# Patient Record
Sex: Male | Born: 1960 | Race: White | Hispanic: No | Smoking: Never smoker
Health system: Southern US, Community
[De-identification: ages and names within clinical notes are randomized; demographics above are authoritative.]

## PROBLEM LIST (undated history)

## (undated) DIAGNOSIS — E7800 Pure hypercholesterolemia, unspecified: Secondary | ICD-10-CM

## (undated) DIAGNOSIS — I639 Cerebral infarction, unspecified: Secondary | ICD-10-CM

## (undated) DIAGNOSIS — Z87442 Personal history of urinary calculi: Secondary | ICD-10-CM

## (undated) DIAGNOSIS — E119 Type 2 diabetes mellitus without complications: Secondary | ICD-10-CM

## (undated) DIAGNOSIS — M1990 Unspecified osteoarthritis, unspecified site: Secondary | ICD-10-CM

## (undated) HISTORY — PX: CHOLECYSTECTOMY: SHX55

## (undated) HISTORY — PX: CYSTOSCOPY W/ URETERAL STENT PLACEMENT: SHX1429

## (undated) HISTORY — PX: APPENDECTOMY: SHX54

## (undated) HISTORY — PX: KNEE ARTHROSCOPY: SUR90

---

## 1997-09-30 HISTORY — PX: SHOULDER ARTHROSCOPY: SHX128

## 1998-08-17 ENCOUNTER — Ambulatory Visit (HOSPITAL_COMMUNITY): Admission: RE | Admit: 1998-08-17 | Discharge: 1998-08-17 | Payer: Self-pay | Admitting: Specialist

## 1998-08-17 ENCOUNTER — Encounter: Payer: Self-pay | Admitting: Specialist

## 2005-02-01 ENCOUNTER — Ambulatory Visit: Payer: Self-pay | Admitting: Cardiology

## 2005-02-02 ENCOUNTER — Emergency Department (HOSPITAL_COMMUNITY): Admission: EM | Admit: 2005-02-02 | Discharge: 2005-02-02 | Payer: Self-pay | Admitting: Emergency Medicine

## 2005-06-14 ENCOUNTER — Ambulatory Visit: Payer: Self-pay | Admitting: Cardiology

## 2005-07-05 ENCOUNTER — Ambulatory Visit: Payer: Self-pay | Admitting: Internal Medicine

## 2005-09-02 ENCOUNTER — Ambulatory Visit: Payer: Self-pay | Admitting: Cardiology

## 2005-09-09 ENCOUNTER — Ambulatory Visit: Payer: Self-pay | Admitting: Cardiology

## 2005-10-14 ENCOUNTER — Ambulatory Visit: Payer: Self-pay | Admitting: Cardiology

## 2005-12-10 ENCOUNTER — Ambulatory Visit: Payer: Self-pay | Admitting: Cardiology

## 2006-04-30 ENCOUNTER — Ambulatory Visit: Payer: Self-pay | Admitting: Cardiology

## 2006-06-06 ENCOUNTER — Ambulatory Visit (HOSPITAL_COMMUNITY): Admission: RE | Admit: 2006-06-06 | Discharge: 2006-06-06 | Payer: Self-pay | Admitting: Specialist

## 2007-06-19 ENCOUNTER — Encounter: Payer: Self-pay | Admitting: Internal Medicine

## 2007-09-16 DIAGNOSIS — R519 Headache, unspecified: Secondary | ICD-10-CM | POA: Insufficient documentation

## 2007-09-16 DIAGNOSIS — R51 Headache: Secondary | ICD-10-CM | POA: Insufficient documentation

## 2007-09-17 ENCOUNTER — Telehealth: Payer: Self-pay | Admitting: Family Medicine

## 2007-09-18 ENCOUNTER — Encounter: Admission: RE | Admit: 2007-09-18 | Discharge: 2007-09-18 | Payer: Self-pay | Admitting: Family Medicine

## 2007-09-18 ENCOUNTER — Telehealth: Payer: Self-pay | Admitting: Family Medicine

## 2007-09-18 ENCOUNTER — Ambulatory Visit: Payer: Self-pay | Admitting: Family Medicine

## 2007-09-18 DIAGNOSIS — Z87442 Personal history of urinary calculi: Secondary | ICD-10-CM | POA: Insufficient documentation

## 2007-09-18 DIAGNOSIS — Z87448 Personal history of other diseases of urinary system: Secondary | ICD-10-CM | POA: Insufficient documentation

## 2007-09-18 DIAGNOSIS — R030 Elevated blood-pressure reading, without diagnosis of hypertension: Secondary | ICD-10-CM | POA: Insufficient documentation

## 2007-12-16 ENCOUNTER — Encounter: Payer: Self-pay | Admitting: Internal Medicine

## 2008-06-03 IMAGING — CR DG SINUSES COMPLETE 3+V
3 series · 3 of 3 positions shown · non-contrast
Comparison: [REDACTED] paranasal sinus radiographs 06/06/06.

CLINICAL DATA: Left facial pain progressive past two days. 
SINUSES THREE VIEWS:

[view not recorded (1 of 3)]
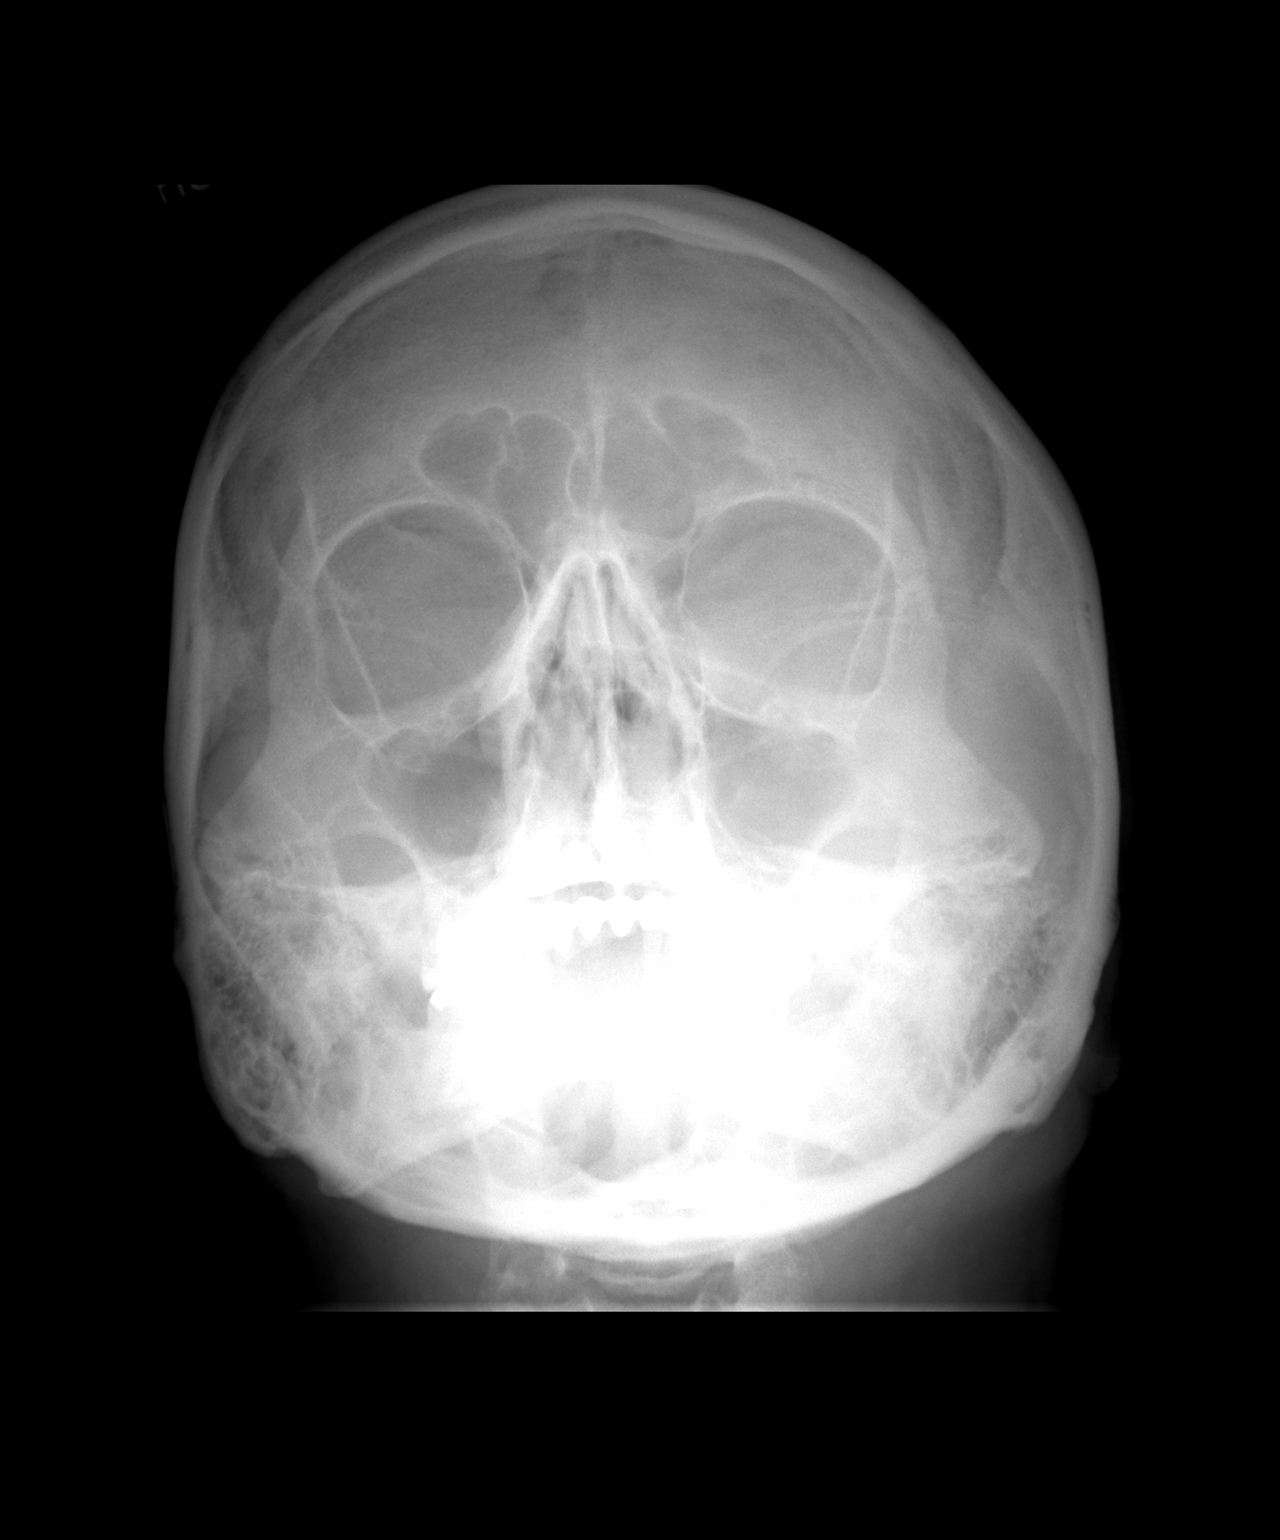

[view not recorded (2 of 3)]
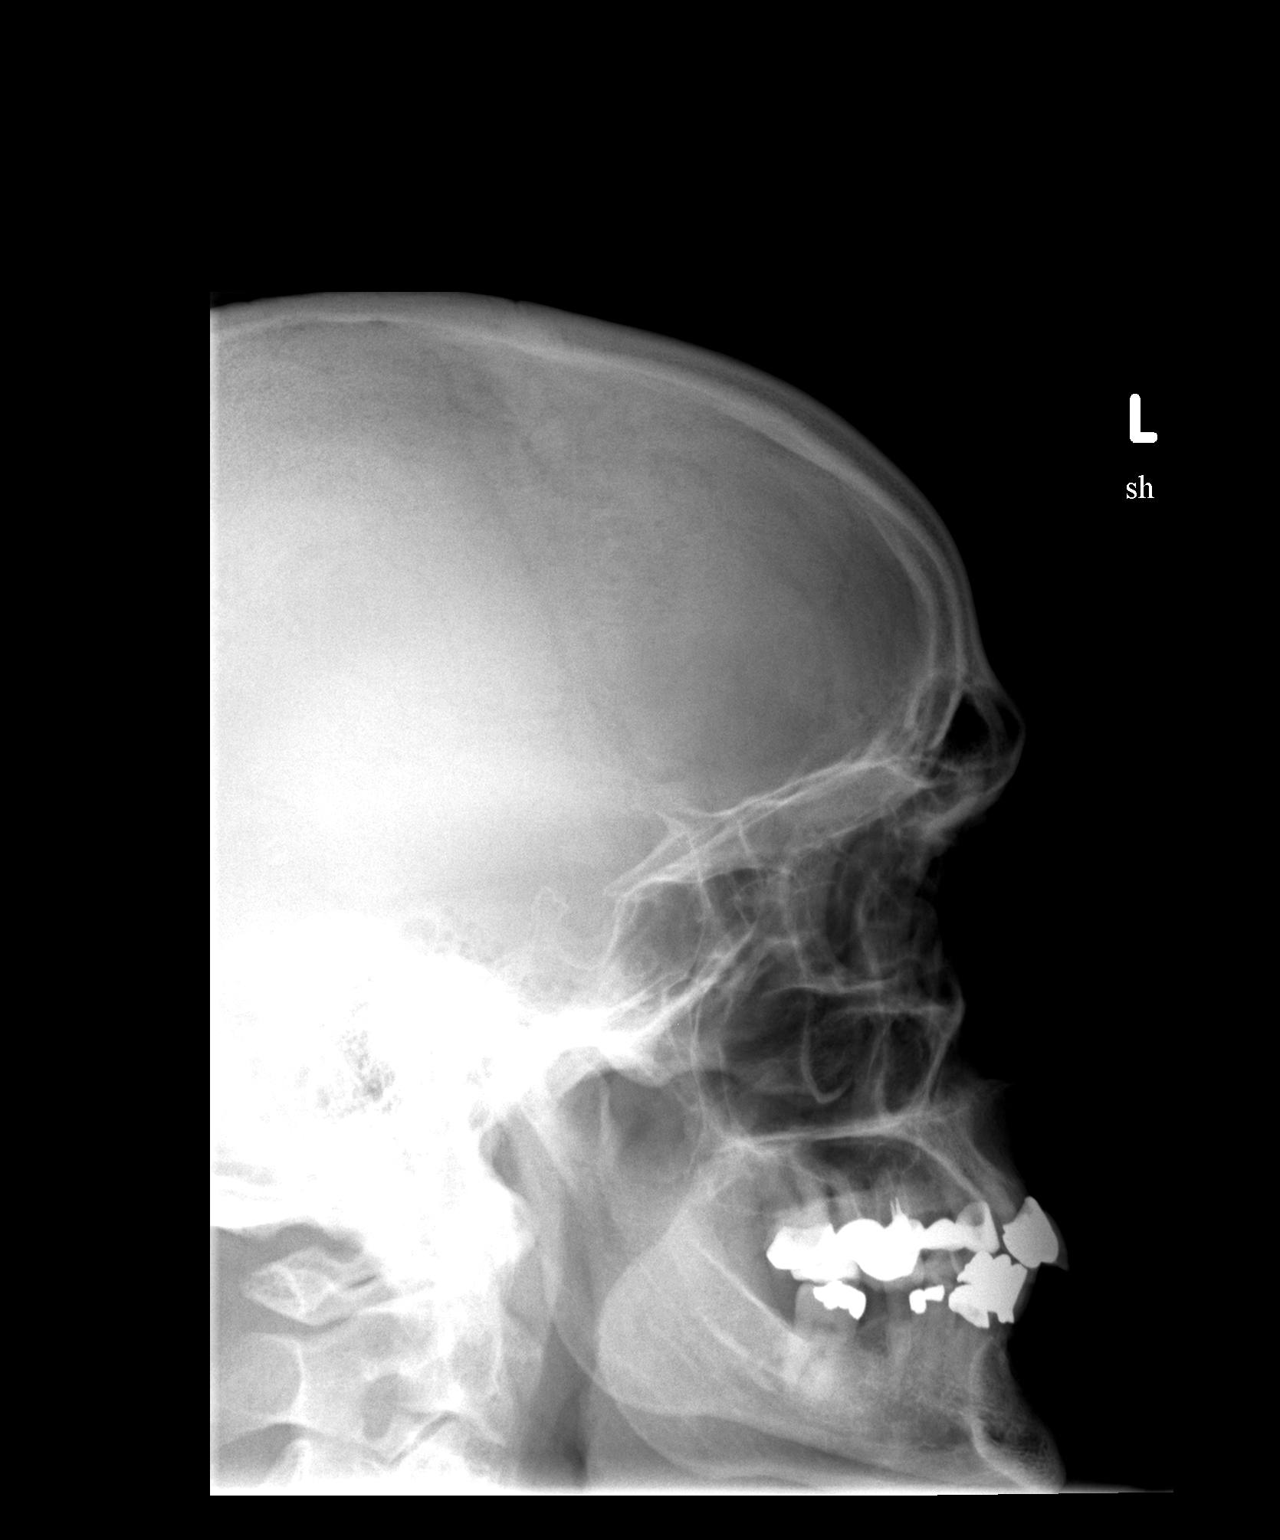

[view not recorded (3 of 3)]
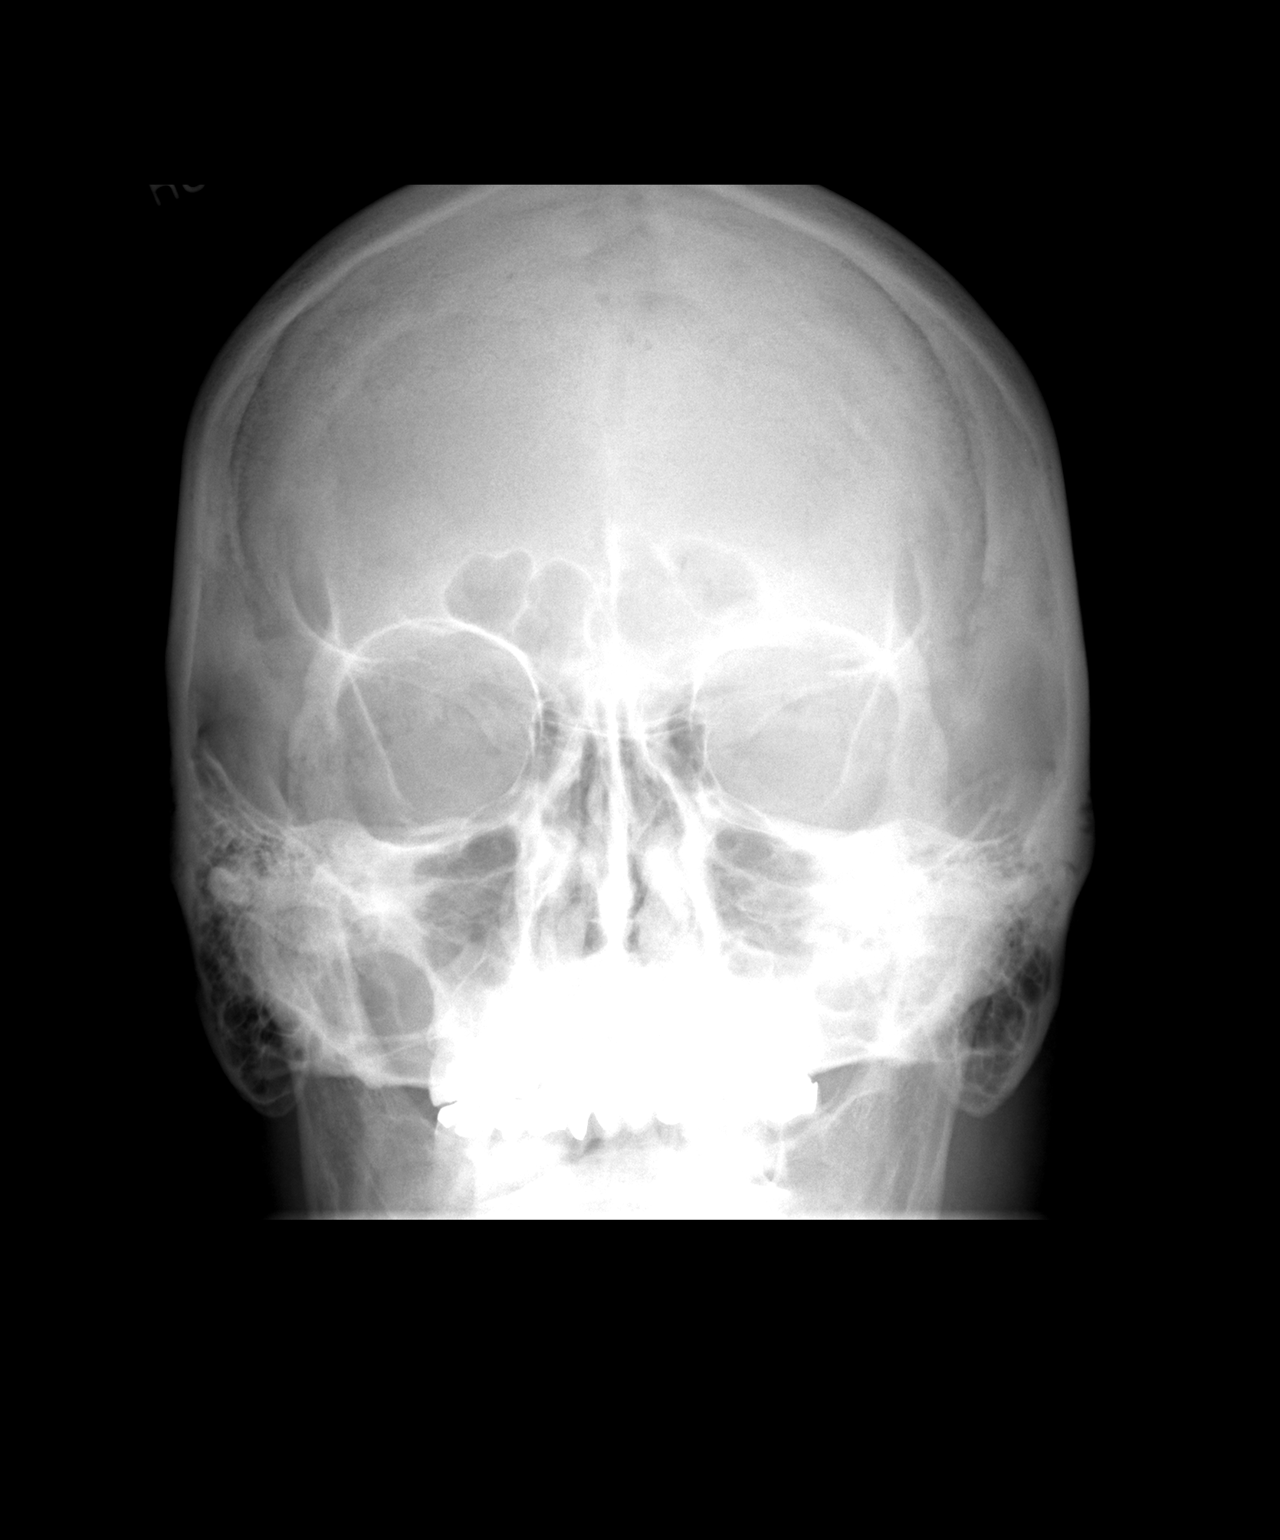

[3 of 3 positions shown; findings below may reference images not displayed]

FINDINGS: No interval change.  Limited coronal CT images were obtained through the paranasal sinuses. There is no evidence of sinus air fluid levels or significant mucosal thickening.  No significant bone abnormalities are identified.
IMPRESSION: Negative.

## 2008-10-07 ENCOUNTER — Encounter: Payer: Self-pay | Admitting: Internal Medicine

## 2011-02-15 NOTE — Assessment & Plan Note (Signed)
[facility]                              CARDIOLOGY OFFICE NOTE   NAME:BECKKoleman, Marling                           MRN:          [id number]  DATE:04/30/2006                            DOB:          January 09, 1961    Mr. Heidinger is seen for cardiology followup.  He is actually doing relatively  well.  He did not tolerate Lipitor.  He agreed to take Zetia and his LDL  came down from 240 to 145.  Because of his family history, I would like to  get him down further and will consider Vytorin.  However, we will first  obtain a followup lipid profile.   He is not having any chest pain.  He is going about normal activities, and  he is doing well.   PAST MEDICAL HISTORY:   ALLERGIES:  NO KNOWN DRUG ALLERGIES OTHER THAN THE FACT THAT HE FEELS POORLY  WITH LIPITOR.   MEDICATIONS:  Zetia 10 mg.   OTHER MEDICAL PROBLEMS:  See the complete list below.   REVIEW OF SYSTEMS:  He is feeling well with no complaints.   PHYSICAL EXAMINATION:  VITAL SIGNS:  Blood pressure 126/88 with a pulse of  71.  GENERAL:  The patient is oriented to person, time, and place and his affect  is normal.  LUNGS:  Clear.  Respiratory effort is not labored.  HEENT:  Reveals no xanthelasma.  He has normal extraocular motion.  There  are no carotid bruits.  There is no jugular venous distention.  CARDIAC:  Reveals an S1 and S2.  There are no clicks or significant murmurs.  ABDOMEN:  Soft.  There are no masses or bruits.  EXTREMITIES:  There is no peripheral edema.  There are no musculoskeletal  deformities.   EKG reveals an interventricular conduction delay that he has had in the  past.  He does have decreased anterior R waves.  He has had this  intermittently in the past.   PROBLEMS:  1.  History of renal stones.  2.  History of abnormal EKG.  EKG is somewhat more abnormal today.  I      considered an echo but I have decided not to.  3.  Hypertensive response on the treadmill.  4.   Borderline high fasting glucose and he needs primary doctor.  5.  Elevated LDL.  See the discussion above.  Fasting lipids to be checked      and then we will be back in touch with him and we may switch him to      Vytorin.                               Luis Abed, MD, [facility]    JDK/MedQ  DD:  04/30/2006  DT:  04/30/2006  Job #:  [id number]

## 2022-07-15 ENCOUNTER — Ambulatory Visit: Payer: Self-pay | Admitting: Orthopedic Surgery

## 2022-08-28 ENCOUNTER — Ambulatory Visit: Payer: Self-pay | Admitting: Orthopedic Surgery

## 2022-08-28 NOTE — H&P (Signed)
[facility] is an 61 y.o. male.   Chief Complaint: R knee pain HPI: Patient is here for his H&P. Patient is scheduled for a right total knee replacement by Dr. Beane on 09/04/22 at [facility].  He has failed conservative treatment at this point with ongoing pain limiting activities of daily living. Patient desires to proceed with surgery.  Dr. Beane and the patient mutually agreed to proceed with a total knee replacement. Risks and benefits of the procedure were discussed including stiffness, suboptimal range of motion, persistent pain, infection requiring removal of prosthesis and reinsertion, need for prophylactic antibiotics in the future, for example, dental procedures, possible need for manipulation, revision in the future and also anesthetic complications including DVT, PE, etc. We discussed the perioperative course, time in the [facility], postoperative recovery and the need for elevation to control swelling. We also discussed the predicted range of motion and the probability that squatting and kneeling would be unobtainable in the future. In addition, postoperative anticoagulation was discussed. We have obtained preoperative medical clearance as necessary. Provided illustrated handout and discussed it in detail. They will enroll in the total joint replacement educational forum at the [facility].  He is scheduled for preop at [facility] tomorrow.  Past Medical Hx: Diabetes Kidney Disease Seizures/Epilepsy Stroke/TIA  Past Surgical Hx: ACL Reconstruction Appendectomy Carpal Tunnel Release Cholecystectomy Colonoscopy Knee Arthroscopy Shoulder Arthroscopy  No family history on file. Social History:  has no history on file for tobacco use, alcohol use, and drug use.  Allergies: No Known Allergies  Current meds: gabapentin 300 mg capsule glimepiride 2 mg tablet Hibiclens 4 % topical liquid ibuprofen 800 mg tablet lamoTRIgine 100 mg tablet levETIRAcetam 1,000 mg  tablet lisinopriL 20 mg-hydrochlorothiazide 12.5 mg tablet metFORMIN 500 mg tablet oxyCODONE-acetaminophen 10 mg-325 mg tablet rosuvastatin 20 mg tablet tamsulosin 0.4 mg capsule temazepam 30 mg capsule tiZANidine 4 mg tablet tobramycin 0.3 %-dexamethasone 0.1 % eye drops,suspension Vitamin D2 1,250 mcg (50,000 unit) capsule  Review of Systems  Constitutional: Negative.   HENT: Negative.    Eyes: Negative.   Respiratory: Negative.    Cardiovascular: Negative.   Gastrointestinal: Negative.   Endocrine: Negative.   Genitourinary: Negative.   Musculoskeletal:  Positive for arthralgias, gait problem, joint swelling and myalgias.  Skin: Negative.   Psychiatric/Behavioral: Negative.      There were no vitals taken for this visit. Physical Exam Constitutional:      Appearance: Normal appearance.  HENT:     Head: Normocephalic.     Right Ear: External ear normal.     Left Ear: External ear normal.     Nose: Nose normal.     Mouth/Throat:     Pharynx: Oropharynx is clear.  Eyes:     Conjunctiva/sclera: Conjunctivae normal.  Cardiovascular:     Rate and Rhythm: Normal rate and regular rhythm.     Pulses: Normal pulses.  Pulmonary:     Effort: Pulmonary effort is normal.  Abdominal:     General: Bowel sounds are normal.  Musculoskeletal:     Cervical back: Normal range of motion.     Comments: He has a positive Lachman. He is tender lateral joint line. He is a mild to moderate effusion his ranges 0-1 10. No DVT  Neurological:     Mental Status: He is alert.      Assessment/Plan Impression: End-stage right knee osteoarthritis  Plan: Pt with end-stage right knee DJD, bone-on-bone, refractory to conservative tx, scheduled for right total knee replacement   by Dr. Shelle Iron on December 6. We again discussed the procedure itself as well as risks, complications and alternatives, including but not limited to DVT, PE, infx, bleeding, failure of procedure, need for secondary procedure  including manipulation, nerve injury, ongoing pain/symptoms, anesthesia risk, even stroke or death. Also discussed typical post-op protocols, activity restrictions, need for PT, flexion/extension exercises, time out of work. Discussed need for DVT ppx post-op per protocol. Discussed dental ppx and infx prevention. Also discussed limitations post-operatively such as kneeling and squatting. All questions were answered. Patient desires to proceed with surgery as scheduled.  Will hold supplements, ASA and NSAIDs accordingly. Will remain NPO after midnight the night before surgery. Will present to [facility] for pre-op testing. Anticipate [facility] stay to include at least 2 midnights given medical history and to ensure proper pain control. Plan [ASA 325mg  BID] for DVT ppx post-op. Plan oxycodone, Robaxin, Colace, Miralax. Plan home with HHPT post-op vs outpt PT with family members at home for assistance. Will follow up 10-14 days post-op for staple removal and xrays.  Plan Right total knee replacement  , PA-C for Dr Dorothy Spark 08/28/2022, 4:33 PM

## 2022-08-28 NOTE — Patient Instructions (Signed)
SURGICAL WAITING ROOM VISITATION Patients having surgery or a procedure may have no more than 2 support people in the waiting area - these visitors may rotate.   Children under the age of 20 must have an adult with them who is not the patient. If the patient needs to stay at the hospital during part of their recovery, the visitor guidelines for inpatient rooms apply. Pre-op nurse will coordinate an appropriate time for 1 support person to accompany patient in pre-op.  This support person may not rotate.    Please refer to the [facility] website for the visitor guidelines for Inpatients (after your surgery is over and you are in a regular room).     Your procedure is scheduled on: 09/04/22   Report to [facility] Main Entrance    Report to admitting at 6:00 AM   Call this number if you have problems the morning of surgery [phone]   Do not eat food :After Midnight.   After Midnight you may have the following liquids until 5:30 AM DAY OF SURGERY  Water Non-Citrus Juices (without pulp, NO RED) Carbonated Beverages Black Coffee (NO MILK/CREAM OR CREAMERS, sugar ok)  Clear Tea (NO MILK/CREAM OR CREAMERS, sugar ok) regular and decaf                             Plain Jell-O (NO RED)                                           Fruit ices (not with fruit pulp, NO RED)                                     Popsicles (NO RED)                                                               Sports drinks like Gatorade (NO RED)    The day of surgery:  Drink ONE (1) Pre-Surgery G2 at 5:30 AM the morning of surgery. Drink in one sitting. Do not sip.  This drink was given to you during your hospital  pre-op appointment visit. Nothing else to drink after completing the  Pre-Surgery G2.          If you have questions, please contact your surgeon's office.   FOLLOW BOWEL PREP AND ANY ADDITIONAL PRE OP INSTRUCTIONS YOU RECEIVED FROM YOUR SURGEON'S OFFICE!!!     Oral Hygiene is also  important to reduce your risk of infection.                                    Remember - BRUSH YOUR TEETH THE MORNING OF SURGERY WITH YOUR REGULAR TOOTHPASTE    Take these medicines the morning of surgery with A SIP OF WATER: Gabapentin, Flomax, Tylenol, Rosuvastatin   DO NOT TAKE ANY ORAL DIABETIC MEDICATIONS DAY OF YOUR SURGERY  How to Manage Your Diabetes Before and After Surgery  Why is it important to  control my blood sugar before and after surgery? Improving blood sugar levels before and after surgery helps healing and can limit problems. A way of improving blood sugar control is eating a healthy diet by:  Eating less sugar and carbohydrates  Increasing activity/exercise  Talking with your doctor about reaching your blood sugar goals High blood sugars (greater than 180 mg/dL) can raise your risk of infections and slow your recovery, so you will need to focus on controlling your diabetes during the weeks before surgery. Make sure that the doctor who takes care of your diabetes knows about your planned surgery including the date and location.  How do I manage my blood sugar before surgery? Check your blood sugar at least 4 times a day, starting 2 days before surgery, to make sure that the level is not too high or low. Check your blood sugar the morning of your surgery when you wake up and every 2 hours until you get to the Short Stay unit. If your blood sugar is less than 70 mg/dL, you will need to treat for low blood sugar: Do not take insulin. Treat a low blood sugar (less than 70 mg/dL) with  cup of clear juice (cranberry or apple), 4 glucose tablets, OR glucose gel. Recheck blood sugar in 15 minutes after treatment (to make sure it is greater than 70 mg/dL). If your blood sugar is not greater than 70 mg/dL on recheck, call [phone] for further instructions. Report your blood sugar to the short stay nurse when you get to Short Stay.  If you are admitted to the hospital after  surgery: Your blood sugar will be checked by the staff and you will probably be given insulin after surgery (instead of oral diabetes medicines) to make sure you have good blood sugar levels. The goal for blood sugar control after surgery is 80-180 mg/dL.   WHAT DO I DO ABOUT MY DIABETES MEDICATION?  Do not take oral diabetes medicines (pills) the morning of surgery.  THE DAY BEFORE SURGERY, take Metformin as prescribed.      THE MORNING OF SURGERY, do not take Metformin   Reviewed and Endorsed by [facility] Patient Education Committee, August 2015             You may not have any metal on your body including jewelry, and body piercing             Do not wear lotions, powders, cologne, or deodorant              Men may shave face and neck.   Do not bring valuables to the hospital. [facility] IS NOT             RESPONSIBLE   FOR VALUABLES.   Bring small overnight bag day of surgery.   DO NOT BRING YOUR HOME MEDICATIONS TO THE HOSPITAL. PHARMACY WILL DISPENSE MEDICATIONS LISTED ON YOUR MEDICATION LIST TO YOU DURING YOUR ADMISSION IN THE HOSPITAL!              Please read over the following fact sheets you were given: IF YOU HAVE QUESTIONS ABOUT YOUR PRE-OP INSTRUCTIONS PLEASE CALL [phone] Contras- Eulonda [place]   If you received a COVID test during your pre-op visit  it is requested that you wear a mask when out in public, stay away from anyone that may not be feeling well and notify your surgeon if you develop symptoms. If you test positive for Covid or have been in contact with anyone that  has tested positive in the last 10 days please notify you surgeon.    [facility] - Preparing for Surgery Before surgery, you can play an important role.  Because skin is not sterile, your skin needs to be as free of germs as possible.  You can reduce the number of germs on your skin by washing with CHG (chlorahexidine gluconate) soap before surgery.  CHG is an antiseptic cleaner which kills germs and  bonds with the skin to continue killing germs even after washing. Please DO NOT use if you have an allergy to CHG or antibacterial soaps.  If your skin becomes reddened/irritated stop using the CHG and inform your nurse when you arrive at Short Stay. Do not shave (including legs and underarms) for at least 48 hours prior to the first CHG shower.  You may shave your face/neck.  Please follow these instructions carefully:  1.  Shower with CHG Soap the night before surgery and the  morning of surgery.  2.  If you choose to wash your hair, wash your hair first as usual with your normal  shampoo.  3.  After you shampoo, rinse your hair and body thoroughly to remove the shampoo.                             4.  Use CHG as you would any other liquid soap.  You can apply chg directly to the skin and wash.  Gently with a scrungie or clean washcloth.  5.  Apply the CHG Soap to your body ONLY FROM THE NECK DOWN.   Do   not use on face/ open                           Wound or open sores. Avoid contact with eyes, ears mouth and   genitals (private parts).                       Wash face,  Genitals (private parts) with your normal soap.             6.  Wash thoroughly, paying special attention to the area where your    surgery  will be performed.  7.  Thoroughly rinse your body with warm water from the neck down.  8.  DO NOT shower/wash with your normal soap after using and rinsing off the CHG Soap.                9.  Pat yourself dry with a clean towel.            10.  Wear clean pajamas.            11.  Place clean sheets on your bed the night of your first shower and do not  sleep with pets. Day of Surgery : Do not apply any lotions/deodorants the morning of surgery.  Please wear clean clothes to the hospital/surgery center.  FAILURE TO FOLLOW THESE INSTRUCTIONS MAY RESULT IN THE CANCELLATION OF YOUR SURGERY  PATIENT SIGNATURE_________________________________  NURSE  SIGNATURE__________________________________  ________________________________________________________________________  Rogelia Mire  An incentive spirometer is a tool that can help keep your lungs clear and active. This tool measures how well you are filling your lungs with each breath. Taking long deep breaths may help reverse or decrease the chance of developing breathing (pulmonary) problems (especially infection) following: A long period of time  when you are unable to move or be active. BEFORE THE PROCEDURE  If the spirometer includes an indicator to show your best effort, your nurse or respiratory therapist will set it to a desired goal. If possible, sit up straight or lean slightly forward. Try not to slouch. Hold the incentive spirometer in an upright position. INSTRUCTIONS FOR USE  Sit on the edge of your bed if possible, or sit up as far as you can in bed or on a chair. Hold the incentive spirometer in an upright position. Breathe out normally. Place the mouthpiece in your mouth and seal your lips tightly around it. Breathe in slowly and as deeply as possible, raising the piston or the ball toward the top of the column. Hold your breath for 3-5 seconds or for as long as possible. Allow the piston or ball to fall to the bottom of the column. Remove the mouthpiece from your mouth and breathe out normally. Rest for a few seconds and repeat Steps 1 through 7 at least 10 times every 1-2 hours when you are awake. Take your time and take a few normal breaths between deep breaths. The spirometer may include an indicator to show your best effort. Use the indicator as a goal to work toward during each repetition. After each set of 10 deep breaths, practice coughing to be sure your lungs are clear. If you have an incision (the cut made at the time of surgery), support your incision when coughing by placing a pillow or rolled up towels firmly against it. Once you are able to get out of  bed, walk around indoors and cough well. You may stop using the incentive spirometer when instructed by your caregiver.  RISKS AND COMPLICATIONS Take your time so you do not get dizzy or light-headed. If you are in pain, you may need to take or ask for pain medication before doing incentive spirometry. It is harder to take a deep breath if you are having pain. AFTER USE Rest and breathe slowly and easily. It can be helpful to keep track of a log of your progress. Your caregiver can provide you with a simple table to help with this. If you are using the spirometer at home, follow these instructions: SEEK MEDICAL CARE IF:  You are having difficultly using the spirometer. You have trouble using the spirometer as often as instructed. Your pain medication is not giving enough relief while using the spirometer. You develop fever of 100.5 F (38.1 C) or higher. SEEK IMMEDIATE MEDICAL CARE IF:  You cough up bloody sputum that had not been present before. You develop fever of 102 F (38.9 C) or greater. You develop worsening pain at or near the incision site. MAKE SURE YOU:  Understand these instructions. Will watch your condition. Will get help right away if you are not doing well or get worse. Document Released: 01/27/2007 Document Revised: 12/09/2011 Document Reviewed: 03/30/2007 [facility] Patient Information 2014 [place], [place].   ________________________________________________________________________

## 2022-08-28 NOTE — H&P (View-Only) (Signed)
Gordon Christian is an 61 y.o. male.   Chief Complaint: R knee pain HPI: Patient is here for his H&P. Patient is scheduled for a right total knee replacement by Dr. Tonita Cong on 09/04/22 at [facility].  He has failed conservative treatment at this point with ongoing pain limiting activities of daily living. Patient desires to proceed with surgery.  Dr. Tonita Cong and the patient mutually agreed to proceed with a total knee replacement. Risks and benefits of the procedure were discussed including stiffness, suboptimal range of motion, persistent pain, infection requiring removal of prosthesis and reinsertion, need for prophylactic antibiotics in the future, for example, dental procedures, possible need for manipulation, revision in the future and also anesthetic complications including DVT, PE, etc. We discussed the perioperative course, time in the hospital, postoperative recovery and the need for elevation to control swelling. We also discussed the predicted range of motion and the probability that squatting and kneeling would be unobtainable in the future. In addition, postoperative anticoagulation was discussed. We have obtained preoperative medical clearance as necessary. Provided illustrated handout and discussed it in detail. They will enroll in the total joint replacement educational forum at the hospital.  He is scheduled for preop at [facility] tomorrow.  Past Medical Hx: Diabetes Kidney Disease Seizures/Epilepsy Stroke/TIA  Past Surgical Hx: ACL Reconstruction Appendectomy Carpal Tunnel Release Cholecystectomy Colonoscopy Knee Arthroscopy Shoulder Arthroscopy  No family history on file. Social History:  has no history on file for tobacco use, alcohol use, and drug use.  Allergies: No Known Allergies  Current meds: gabapentin 300 mg capsule glimepiride 2 mg tablet Hibiclens 4 % topical liquid ibuprofen 800 mg tablet lamoTRIgine 100 mg tablet levETIRAcetam 1,000 mg  tablet lisinopriL 20 mg-hydrochlorothiazide 12.5 mg tablet metFORMIN 500 mg tablet oxyCODONE-acetaminophen 10 mg-325 mg tablet rosuvastatin 20 mg tablet tamsulosin 0.4 mg capsule temazepam 30 mg capsule tiZANidine 4 mg tablet tobramycin 0.3 %-dexamethasone 0.1 % eye drops,suspension Vitamin D2 1,250 mcg (50,000 unit) capsule  Review of Systems  Constitutional: Negative.   HENT: Negative.    Eyes: Negative.   Respiratory: Negative.    Cardiovascular: Negative.   Gastrointestinal: Negative.   Endocrine: Negative.   Genitourinary: Negative.   Musculoskeletal:  Positive for arthralgias, gait problem, joint swelling and myalgias.  Skin: Negative.   Psychiatric/Behavioral: Negative.      There were no vitals taken for this visit. Physical Exam Constitutional:      Appearance: Normal appearance.  HENT:     Head: Normocephalic.     Right Ear: External ear normal.     Left Ear: External ear normal.     Nose: Nose normal.     Mouth/Throat:     Pharynx: Oropharynx is clear.  Eyes:     Conjunctiva/sclera: Conjunctivae normal.  Cardiovascular:     Rate and Rhythm: Normal rate and regular rhythm.     Pulses: Normal pulses.  Pulmonary:     Effort: Pulmonary effort is normal.  Abdominal:     General: Bowel sounds are normal.  Musculoskeletal:     Cervical back: Normal range of motion.     Comments: He has a positive Lachman. He is tender lateral joint line. He is a mild to moderate effusion his ranges 0-1 10. No DVT  Neurological:     Mental Status: He is alert.      Assessment/Plan Impression: End-stage right knee osteoarthritis  Plan: Pt with end-stage right knee DJD, bone-on-bone, refractory to conservative tx, scheduled for right total knee replacement  by Dr. Shelle Iron on December 6. We again discussed the procedure itself as well as risks, complications and alternatives, including but not limited to DVT, PE, infx, bleeding, failure of procedure, need for secondary procedure  including manipulation, nerve injury, ongoing pain/symptoms, anesthesia risk, even stroke or death. Also discussed typical post-op protocols, activity restrictions, need for PT, flexion/extension exercises, time out of work. Discussed need for DVT ppx post-op per protocol. Discussed dental ppx and infx prevention. Also discussed limitations post-operatively such as kneeling and squatting. All questions were answered. Patient desires to proceed with surgery as scheduled.  Will hold supplements, ASA and NSAIDs accordingly. Will remain NPO after midnight the night before surgery. Will present to [facility] for pre-op testing. Anticipate hospital stay to include at least 2 midnights given medical history and to ensure proper pain control. Plan [ASA 325mg  BID] for DVT ppx post-op. Plan oxycodone, Robaxin, Colace, Miralax. Plan home with HHPT post-op vs outpt PT with family members at home for assistance. Will follow up 10-14 days post-op for staple removal and xrays.  Plan Right total knee replacement  , PA-C for Dr Dorothy Spark 08/28/2022, 4:33 PM

## 2022-08-28 NOTE — Progress Notes (Addendum)
COVID Vaccine Completed: yes  Date of COVID positive in last 90 days: no  PCP - Teodoro Spray, MD Cardiologist - [place], DO LOV 06/10/14 Neurologist- Alice Rieger, MD  Chest x-ray - n/a EKG - 08/29/22 Epic/chart Stress Test - yes before 2017 per pt ECHO - 2017 [facility] Cardiac Cath - n/a Pacemaker/ICD device last checked: n/a Spinal Cord Stimulator: n/a  Bowel Prep - n/a  Sleep Study - yes, negative CPAP -   Fasting Blood Sugar - hasn't been checking at home, does not have a glucometer, plans to get one 09/03/22 Checks Blood Sugar   Last dose of GLP1 agonist-  N/A GLP1 instructions:  N/A   Last dose of SGLT-2 inhibitors-  N/A SGLT-2 instructions: N/A   Blood Thinner Instructions: Aspirin Instructions: ASA 325, last dose 08/28/22 2100 Last Dose:  Activity level: Can go up a flight of stairs and perform activities of daily living without stopping and without symptoms of chest pain or shortness of breath.   Anesthesia review: HTN, LBBB, stroke has balance issues, DM2, pt BS was 326. He denies any symptoms today. Reports he drank a soda right before coming in. Instructed pt to get a glucometer and check his sugars at home. Informed he is at risk for cancellation if BS is greater than 250 DOS. Patient verbalized understanding. Creatinine 1.69  Patient denies shortness of breath, fever, cough and chest pain at PAT appointment  Patient verbalized understanding of instructions that were given to them at the PAT appointment. Patient was also instructed that they will need to review over the PAT instructions again at home before surgery.

## 2022-08-29 ENCOUNTER — Encounter (HOSPITAL_COMMUNITY)
Admission: RE | Admit: 2022-08-29 | Discharge: 2022-08-29 | Disposition: A | Discharge status: Home / Self Care | Payer: Medicare Other | Source: Ambulatory Visit | Attending: Specialist | Admitting: Specialist

## 2022-08-29 ENCOUNTER — Encounter (HOSPITAL_COMMUNITY): Payer: Self-pay

## 2022-08-29 VITALS — BP 122/75 | HR 61 | Temp 97.5°F | Resp 14 | Ht 66.5 in | Wt 173.0 lb

## 2022-08-29 DIAGNOSIS — Z8673 Personal history of transient ischemic attack (TIA), and cerebral infarction without residual deficits: Secondary | ICD-10-CM | POA: Diagnosis not present

## 2022-08-29 DIAGNOSIS — M1711 Unilateral primary osteoarthritis, right knee: Secondary | ICD-10-CM | POA: Diagnosis not present

## 2022-08-29 DIAGNOSIS — E119 Type 2 diabetes mellitus without complications: Secondary | ICD-10-CM | POA: Insufficient documentation

## 2022-08-29 DIAGNOSIS — N189 Chronic kidney disease, unspecified: Secondary | ICD-10-CM | POA: Diagnosis not present

## 2022-08-29 DIAGNOSIS — Z01818 Encounter for other preprocedural examination: Secondary | ICD-10-CM | POA: Diagnosis not present

## 2022-08-29 DIAGNOSIS — I447 Left bundle-branch block, unspecified: Secondary | ICD-10-CM | POA: Diagnosis not present

## 2022-08-29 HISTORY — DX: Personal history of urinary calculi: Z87.442

## 2022-08-29 HISTORY — DX: Unspecified osteoarthritis, unspecified site: M19.90

## 2022-08-29 HISTORY — DX: Type 2 diabetes mellitus without complications: E11.9

## 2022-08-29 HISTORY — DX: Cerebral infarction, unspecified: I63.9

## 2022-08-29 HISTORY — DX: Pure hypercholesterolemia, unspecified: E78.00

## 2022-08-29 LAB — CBC
HCT: 45.1 % (ref 39.0–52.0)
Hemoglobin: 14.7 g/dL (ref 13.0–17.0)
MCH: 30.1 pg (ref 26.0–34.0)
MCHC: 32.6 g/dL (ref 30.0–36.0)
MCV: 92.4 fL (ref 80.0–100.0)
Platelets: 122 10*3/uL — ABNORMAL LOW (ref 150–400)
RBC: 4.88 MIL/uL (ref 4.22–5.81)
RDW: 12 % (ref 11.5–15.5)
WBC: 5.8 10*3/uL (ref 4.0–10.5)
nRBC: 0 % (ref 0.0–0.2)

## 2022-08-29 LAB — BASIC METABOLIC PANEL
Anion gap: 10 (ref 5–15)
BUN: 27 mg/dL — ABNORMAL HIGH (ref 8–23)
CO2: 21 mmol/L — ABNORMAL LOW (ref 22–32)
Calcium: 9.3 mg/dL (ref 8.9–10.3)
Chloride: 106 mmol/L (ref 98–111)
Creatinine, Ser: 1.69 mg/dL — ABNORMAL HIGH (ref 0.61–1.24)
GFR, Estimated: 45 mL/min — ABNORMAL LOW (ref >60)
Glucose, Bld: 316 mg/dL — ABNORMAL HIGH (ref 70–99)
Potassium: 3.7 mmol/L (ref 3.5–5.1)
Sodium: 137 mmol/L (ref 135–145)

## 2022-08-29 LAB — HEMOGLOBIN A1C
Hgb A1c MFr Bld: 7.8 % — ABNORMAL HIGH (ref 4.8–5.6)
Mean Plasma Glucose: 177 mg/dL

## 2022-08-29 LAB — SURGICAL PCR SCREEN
MRSA, PCR: NEGATIVE
Staphylococcus aureus: NEGATIVE

## 2022-08-29 LAB — GLUCOSE, CAPILLARY
Glucose-Capillary: 326 mg/dL — ABNORMAL HIGH (ref 70–99)
Glucose-Capillary: 326 mg/dL — ABNORMAL HIGH (ref 70–99)

## 2022-08-30 NOTE — Anesthesia Preprocedure Evaluation (Addendum)
Anesthesia Evaluation  Patient identified by MRN, date of birth, ID band Patient awake    Reviewed: Allergy & Precautions, NPO status , Patient's Chart, lab work & pertinent test results  History of Anesthesia Complications Negative for: history of anesthetic complications  Airway Mallampati: II  TM Distance: >3 FB Neck ROM: Full    Dental  (+) Dental Advisory Given, Teeth Intact   Pulmonary neg pulmonary ROS   Pulmonary exam normal        Cardiovascular hypertension, Pt. on medications Normal cardiovascular exam     Neuro/Psych  Headaches CVA, Residual Symptoms  negative psych ROS   GI/Hepatic negative GI ROS, Neg liver ROS,,,  Endo/Other  diabetes, Type 2, Oral Hypoglycemic Agents    Renal/GU CRFRenal disease     Musculoskeletal  (+) Arthritis ,    Abdominal   Peds  Hematology  Plt 122k    Anesthesia Other Findings   Reproductive/Obstetrics                             Anesthesia Physical Anesthesia Plan  ASA: 3  Anesthesia Plan: Spinal   Post-op Pain Management: Tylenol PO (pre-op)* and Regional block*   Induction:   PONV Risk Score and Plan: 1 and Treatment may vary due to age or medical condition and Propofol infusion  Airway Management Planned: Natural Airway and Simple Face Mask  Additional Equipment: None  Intra-op Plan:   Post-operative Plan:   Informed Consent: I have reviewed the patients History and Physical, chart, labs and discussed the procedure including the risks, benefits and alternatives for the proposed anesthesia with the patient or authorized representative who has indicated his/her understanding and acceptance.       Plan Discussed with: CRNA and Anesthesiologist  Anesthesia Plan Comments: (Labs reviewed, platelets acceptable. Discussed risks and benefits of spinal, including spinal/epidural hematoma, infection, failed block, and PDPH. Patient  expressed understanding and wished to proceed. )       Anesthesia Quick Evaluation

## 2022-08-30 NOTE — Progress Notes (Signed)
Anesthesia Chart Review   Case: [id number] Date/Time: 09/04/22 0815   Procedure: TOTAL KNEE ARTHROPLASTY (Right: Knee)   Anesthesia type: Spinal   Pre-op diagnosis: Right knee degenerative joint disease   Location: [facility] ROOM 06 / [facility]   Surgeons: Jene Every, MD       DISCUSSION:61 y.o. never smoker with h/o CVA, chronic LBBB, DM II, CKD, right knee djd scheduled for above procedure 09/04/2022 with Dr. Jene Every.   Chronic LBBB seen as far back at 2013 in [facility].    Echo 09/13/2016 with Grade I mild diastolic dysfunction, EF 55-60%, no valvular problems.   A1C 7.8, surgeon made aware.   Creatinine 1.69 at PAT visit.  Appears stable.   09/19/2021 Creatinine 1.75 07/10/2021 Creatinine 1.93 01/01/2021 Creatinine 2.02 VS: BP 122/75   Pulse 61   Temp (!) 36.4 C (Oral)   Resp 14   Ht 5' 6.5" (1.689 m)   Wt 78.5 kg   SpO2 98%   BMI 27.50 kg/m   PROVIDERS: Teodoro Spray, MD is PCP    LABS: Labs reviewed: Acceptable for surgery. (all labs ordered are listed, but only abnormal results are displayed)  Labs Reviewed  HEMOGLOBIN A1C - Abnormal; Notable for the following components:      Result Value   Hgb A1c MFr Bld 7.8 (*)    All other components within normal limits  BASIC METABOLIC PANEL - Abnormal; Notable for the following components:   CO2 21 (*)    Glucose, Bld 316 (*)    BUN 27 (*)    Creatinine, Ser 1.69 (*)    GFR, Estimated 45 (*)    All other components within normal limits  CBC - Abnormal; Notable for the following components:   Platelets 122 (*)    All other components within normal limits  GLUCOSE, CAPILLARY - Abnormal; Notable for the following components:   Glucose-Capillary 326 (*)    All other components within normal limits  GLUCOSE, CAPILLARY - Abnormal; Notable for the following components:   Glucose-Capillary 326 (*)    All other components within normal limits  SURGICAL PCR SCREEN     IMAGES:   EKG:   CV: Echo  09/13/2016 The left ventricle is normal in size.  Septal motion is consistent with conduction abnormality.  The right ventricular ejection fraction is normal.  Injection of contrast documented no interatrial shunt .  Pulmonary hypertension is not suggested by Doppler findings.  The aortic valve is trileaflet.  There is no thrombus.  There is normal left ventricular wall thickness.  The left ventricular ejection fraction is normal (55-60%).  Grade I mild diastolic dysfunction; abnormal relaxation pattern.  Past Medical History:  Diagnosis Date   Arthritis    Diabetes mellitus without complication (HCC)    History of kidney stones    Hypercholesteremia    Stroke [facility])     Past Surgical History:  Procedure Laterality Date   APPENDECTOMY     CHOLECYSTECTOMY     CYSTOSCOPY W/ URETERAL STENT PLACEMENT     KNEE ARTHROSCOPY Right    SHOULDER ARTHROSCOPY Right 1999    MEDICATIONS:  acetaminophen (TYLENOL) 650 MG CR tablet   aspirin 325 MG tablet   Carboxymethylcellul-Glycerin (LUBRICATING EYE DROPS OP)   gabapentin (NEURONTIN) 300 MG capsule   levETIRAcetam (KEPPRA) 1000 MG tablet   lisinopril-hydrochlorothiazide (ZESTORETIC) 20-12.5 MG tablet   metFORMIN (GLUCOPHAGE) 500 MG tablet   rosuvastatin (CRESTOR) 20 MG tablet   temazepam (RESTORIL) 30 MG capsule  tiZANidine (ZANAFLEX) 4 MG tablet   Vitamin D, Ergocalciferol, (DRISDOL) 1.25 MG (50000 UNIT) CAPS capsule   No current facility-administered medications for this encounter.     Jodell Cipro Ward, PA-C [facility]Surgical Testing [phone]

## 2022-09-04 ENCOUNTER — Ambulatory Visit (HOSPITAL_COMMUNITY)
Admission: RE | Admit: 2022-09-04 | Discharge: 2022-09-05 | Disposition: A | Discharge status: Home / Self Care | Payer: Medicare Other | Source: Ambulatory Visit | Attending: Specialist | Admitting: Specialist

## 2022-09-04 ENCOUNTER — Ambulatory Visit (HOSPITAL_COMMUNITY): Payer: Medicare Other

## 2022-09-04 ENCOUNTER — Other Ambulatory Visit: Payer: Self-pay

## 2022-09-04 ENCOUNTER — Encounter (HOSPITAL_COMMUNITY): Payer: Self-pay | Admitting: Specialist

## 2022-09-04 ENCOUNTER — Ambulatory Visit (HOSPITAL_BASED_OUTPATIENT_CLINIC_OR_DEPARTMENT_OTHER): Payer: Medicare Other | Admitting: Anesthesiology

## 2022-09-04 ENCOUNTER — Ambulatory Visit (HOSPITAL_COMMUNITY): Payer: Medicare Other | Admitting: Physician Assistant

## 2022-09-04 ENCOUNTER — Encounter (HOSPITAL_COMMUNITY)
Admission: RE | Disposition: A | Discharge status: Home / Self Care | Payer: Self-pay | Source: Ambulatory Visit | Attending: Specialist

## 2022-09-04 DIAGNOSIS — M1711 Unilateral primary osteoarthritis, right knee: Secondary | ICD-10-CM | POA: Diagnosis present

## 2022-09-04 DIAGNOSIS — I6990 Unspecified sequelae of unspecified cerebrovascular disease: Secondary | ICD-10-CM | POA: Diagnosis not present

## 2022-09-04 DIAGNOSIS — I10 Essential (primary) hypertension: Secondary | ICD-10-CM | POA: Diagnosis not present

## 2022-09-04 DIAGNOSIS — Z472 Encounter for removal of internal fixation device: Secondary | ICD-10-CM | POA: Diagnosis not present

## 2022-09-04 DIAGNOSIS — I6930 Unspecified sequelae of cerebral infarction: Secondary | ICD-10-CM | POA: Insufficient documentation

## 2022-09-04 DIAGNOSIS — M21061 Valgus deformity, not elsewhere classified, right knee: Secondary | ICD-10-CM | POA: Insufficient documentation

## 2022-09-04 DIAGNOSIS — E119 Type 2 diabetes mellitus without complications: Secondary | ICD-10-CM | POA: Insufficient documentation

## 2022-09-04 DIAGNOSIS — Z79899 Other long term (current) drug therapy: Secondary | ICD-10-CM | POA: Insufficient documentation

## 2022-09-04 DIAGNOSIS — M1990 Unspecified osteoarthritis, unspecified site: Secondary | ICD-10-CM | POA: Insufficient documentation

## 2022-09-04 DIAGNOSIS — Z9889 Other specified postprocedural states: Secondary | ICD-10-CM | POA: Diagnosis not present

## 2022-09-04 DIAGNOSIS — Z7984 Long term (current) use of oral hypoglycemic drugs: Secondary | ICD-10-CM | POA: Insufficient documentation

## 2022-09-04 HISTORY — PX: TOTAL KNEE ARTHROPLASTY: SHX125

## 2022-09-04 LAB — GLUCOSE, CAPILLARY
Glucose-Capillary: 146 mg/dL — ABNORMAL HIGH (ref 70–99)
Glucose-Capillary: 142 mg/dL — ABNORMAL HIGH (ref 70–99)
Glucose-Capillary: 201 mg/dL — ABNORMAL HIGH (ref 70–99)
Glucose-Capillary: 211 mg/dL — ABNORMAL HIGH (ref 70–99)

## 2022-09-04 SURGERY — ARTHROPLASTY, KNEE, TOTAL
Anesthesia: Spinal | Site: Knee | Laterality: Right

## 2022-09-04 MED ORDER — METOCLOPRAMIDE HCL 5 MG PO TABS: 5.0000 mg | ORAL_TABLET | Freq: Three times a day (TID) | ORAL | Status: DC | PRN

## 2022-09-04 MED ORDER — CEFAZOLIN SODIUM-DEXTROSE 2-4 GM/100ML-% IV SOLN
2.0000 g | INTRAVENOUS | Status: AC
  Administered 2022-09-04: 2 g via INTRAVENOUS
  Filled 2022-09-04: qty 100

## 2022-09-04 MED ORDER — EPHEDRINE 5 MG/ML INJ
INTRAVENOUS | Status: AC
  Filled 2022-09-04: qty 5

## 2022-09-04 MED ORDER — RISAQUAD PO CAPS
1.0000 | ORAL_CAPSULE | Freq: Every day | ORAL | Status: DC
  Administered 2022-09-04 – 2022-09-05 (×2): 1 via ORAL
  Filled 2022-09-04 (×2): qty 1

## 2022-09-04 MED ORDER — BUPIVACAINE-EPINEPHRINE (PF) 0.5% -1:200000 IJ SOLN
INTRAMUSCULAR | Status: DC | PRN
  Administered 2022-09-04 (×2): 30 mL

## 2022-09-04 MED ORDER — GABAPENTIN 300 MG PO CAPS
300.0000 mg | ORAL_CAPSULE | Freq: Two times a day (BID) | ORAL | Status: DC
  Administered 2022-09-04 – 2022-09-05 (×3): 300 mg via ORAL
  Filled 2022-09-04 (×3): qty 1

## 2022-09-04 MED ORDER — KCL IN DEXTROSE-NACL 20-5-0.45 MEQ/L-%-% IV SOLN
INTRAVENOUS | Status: AC
  Filled 2022-09-04 (×2): qty 1000

## 2022-09-04 MED ORDER — OXYCODONE HCL 5 MG PO TABS: 5.0000 mg | ORAL_TABLET | ORAL | 0 refills | Status: DC | PRN

## 2022-09-04 MED ORDER — STERILE WATER FOR IRRIGATION IR SOLN
Status: DC | PRN
  Administered 2022-09-04: 2000 mL

## 2022-09-04 MED ORDER — LEVETIRACETAM 500 MG PO TABS
1000.0000 mg | ORAL_TABLET | Freq: Every day | ORAL | Status: DC
  Administered 2022-09-04: 1000 mg via ORAL
  Filled 2022-09-04: qty 2

## 2022-09-04 MED ORDER — DIPHENHYDRAMINE HCL 12.5 MG/5ML PO ELIX: 12.5000 mg | ORAL_SOLUTION | ORAL | Status: DC | PRN

## 2022-09-04 MED ORDER — ONDANSETRON HCL 4 MG/2ML IJ SOLN
INTRAMUSCULAR | Status: DC | PRN
  Administered 2022-09-04: 4 mg via INTRAVENOUS

## 2022-09-04 MED ORDER — HYDROMORPHONE HCL 1 MG/ML IJ SOLN
0.5000 mg | INTRAMUSCULAR | Status: DC | PRN
  Administered 2022-09-04: 1 mg via INTRAVENOUS
  Filled 2022-09-04: qty 1

## 2022-09-04 MED ORDER — 0.9 % SODIUM CHLORIDE (POUR BTL) OPTIME
TOPICAL | Status: DC | PRN
  Administered 2022-09-04: 1000 mL

## 2022-09-04 MED ORDER — METOCLOPRAMIDE HCL 5 MG/ML IJ SOLN: 5.0000 mg | Freq: Three times a day (TID) | INTRAMUSCULAR | Status: DC | PRN

## 2022-09-04 MED ORDER — ACETAMINOPHEN 500 MG PO TABS: 1000.0000 mg | ORAL_TABLET | Freq: Once | ORAL | Status: DC

## 2022-09-04 MED ORDER — BUPIVACAINE IN DEXTROSE 0.75-8.25 % IT SOLN
INTRATHECAL | Status: DC | PRN
  Administered 2022-09-04: 1.8 mL via INTRATHECAL

## 2022-09-04 MED ORDER — POLYVINYL ALCOHOL 1.4 % OP SOLN
1.0000 [drp] | Freq: Every day | OPHTHALMIC | Status: DC | PRN
  Filled 2022-09-04: qty 15

## 2022-09-04 MED ORDER — TEMAZEPAM 15 MG PO CAPS
30.0000 mg | ORAL_CAPSULE | Freq: Every day | ORAL | Status: DC
  Administered 2022-09-05: 30 mg via ORAL
  Filled 2022-09-04: qty 2

## 2022-09-04 MED ORDER — OXYCODONE HCL 5 MG/5ML PO SOLN: 5.0000 mg | Freq: Once | ORAL | Status: DC | PRN

## 2022-09-04 MED ORDER — FENTANYL CITRATE PF 50 MCG/ML IJ SOSY: 25.0000 ug | PREFILLED_SYRINGE | INTRAMUSCULAR | Status: DC | PRN

## 2022-09-04 MED ORDER — TRANEXAMIC ACID-NACL 1000-0.7 MG/100ML-% IV SOLN
1000.0000 mg | INTRAVENOUS | Status: AC
  Administered 2022-09-04: 1000 mg via INTRAVENOUS
  Filled 2022-09-04: qty 100

## 2022-09-04 MED ORDER — LACTATED RINGERS IV SOLN: INTRAVENOUS | Status: DC

## 2022-09-04 MED ORDER — BISACODYL 5 MG PO TBEC: 5.0000 mg | DELAYED_RELEASE_TABLET | Freq: Every day | ORAL | Status: DC | PRN

## 2022-09-04 MED ORDER — EPHEDRINE SULFATE-NACL 50-0.9 MG/10ML-% IV SOSY
PREFILLED_SYRINGE | INTRAVENOUS | Status: DC | PRN
  Administered 2022-09-04 (×4): 5 mg via INTRAVENOUS

## 2022-09-04 MED ORDER — ONDANSETRON HCL 4 MG/2ML IJ SOLN
INTRAMUSCULAR | Status: AC
  Filled 2022-09-04: qty 2

## 2022-09-04 MED ORDER — ONDANSETRON HCL 4 MG/2ML IJ SOLN
4.0000 mg | Freq: Four times a day (QID) | INTRAMUSCULAR | Status: DC | PRN
  Administered 2022-09-04: 4 mg via INTRAVENOUS
  Filled 2022-09-04: qty 2

## 2022-09-04 MED ORDER — PROPOFOL 10 MG/ML IV BOLUS
INTRAVENOUS | Status: DC | PRN
  Administered 2022-09-04: 30 mg via INTRAVENOUS

## 2022-09-04 MED ORDER — HYDROMORPHONE HCL 2 MG PO TABS
2.0000 mg | ORAL_TABLET | ORAL | Status: DC | PRN
  Administered 2022-09-05: 2 mg via ORAL
  Filled 2022-09-04: qty 1

## 2022-09-04 MED ORDER — INSULIN ASPART 100 UNIT/ML IJ SOLN
0.0000 [IU] | Freq: Three times a day (TID) | INTRAMUSCULAR | Status: DC
  Administered 2022-09-04: 5 [IU] via SUBCUTANEOUS
  Administered 2022-09-05: 2 [IU] via SUBCUTANEOUS
  Administered 2022-09-05: 3 [IU] via SUBCUTANEOUS

## 2022-09-04 MED ORDER — FENTANYL CITRATE (PF) 100 MCG/2ML IJ SOLN
INTRAMUSCULAR | Status: AC
  Filled 2022-09-04: qty 2

## 2022-09-04 MED ORDER — BUPIVACAINE-EPINEPHRINE (PF) 0.5% -1:200000 IJ SOLN
INTRAMUSCULAR | Status: AC
  Filled 2022-09-04: qty 30

## 2022-09-04 MED ORDER — ROPIVACAINE HCL 7.5 MG/ML IJ SOLN
INTRAMUSCULAR | Status: DC | PRN
  Administered 2022-09-04: 20 mL via PERINEURAL

## 2022-09-04 MED ORDER — SODIUM CHLORIDE (PF) 0.9 % IJ SOLN
INTRAMUSCULAR | Status: AC
  Filled 2022-09-04: qty 50

## 2022-09-04 MED ORDER — OXYCODONE HCL 5 MG PO TABS: 5.0000 mg | ORAL_TABLET | Freq: Once | ORAL | Status: DC | PRN

## 2022-09-04 MED ORDER — PROPOFOL 500 MG/50ML IV EMUL
INTRAVENOUS | Status: DC | PRN
  Administered 2022-09-04: 100 ug/kg/min via INTRAVENOUS

## 2022-09-04 MED ORDER — PROPOFOL 1000 MG/100ML IV EMUL
INTRAVENOUS | Status: AC
  Filled 2022-09-04: qty 100

## 2022-09-04 MED ORDER — SODIUM CHLORIDE 0.9 % IR SOLN
Status: DC | PRN
  Administered 2022-09-04: 1000 mL

## 2022-09-04 MED ORDER — SODIUM CHLORIDE 0.9% FLUSH
INTRAVENOUS | Status: DC | PRN
  Administered 2022-09-04: 40 mL via INTRAVENOUS

## 2022-09-04 MED ORDER — ORAL CARE MOUTH RINSE: 15.0000 mL | Freq: Once | OROMUCOSAL | Status: AC

## 2022-09-04 MED ORDER — PHENOL 1.4 % MT LIQD: 1.0000 | OROMUCOSAL | Status: DC | PRN

## 2022-09-04 MED ORDER — MIDAZOLAM HCL 5 MG/5ML IJ SOLN
INTRAMUSCULAR | Status: DC | PRN
  Administered 2022-09-04 (×2): 1 mg via INTRAVENOUS

## 2022-09-04 MED ORDER — CEFAZOLIN SODIUM-DEXTROSE 2-4 GM/100ML-% IV SOLN
2.0000 g | Freq: Four times a day (QID) | INTRAVENOUS | Status: AC
  Administered 2022-09-04 (×2): 2 g via INTRAVENOUS
  Filled 2022-09-04 (×2): qty 100

## 2022-09-04 MED ORDER — TIZANIDINE HCL 4 MG PO TABS
4.0000 mg | ORAL_TABLET | Freq: Every day | ORAL | Status: DC
  Administered 2022-09-04: 4 mg via ORAL
  Filled 2022-09-04: qty 1

## 2022-09-04 MED ORDER — HYDROCHLOROTHIAZIDE 12.5 MG PO TABS
12.5000 mg | ORAL_TABLET | Freq: Every day | ORAL | Status: DC
  Administered 2022-09-05: 12.5 mg via ORAL
  Filled 2022-09-04: qty 1

## 2022-09-04 MED ORDER — VITAMIN D (ERGOCALCIFEROL) 1.25 MG (50000 UNIT) PO CAPS
50000.0000 [IU] | ORAL_CAPSULE | ORAL | Status: DC
  Administered 2022-09-04: 50000 [IU] via ORAL
  Filled 2022-09-04: qty 1

## 2022-09-04 MED ORDER — ACETAMINOPHEN 10 MG/ML IV SOLN
1000.0000 mg | INTRAVENOUS | Status: AC
  Administered 2022-09-04: 1000 mg via INTRAVENOUS
  Filled 2022-09-04: qty 100

## 2022-09-04 MED ORDER — FENTANYL CITRATE (PF) 100 MCG/2ML IJ SOLN
INTRAMUSCULAR | Status: DC | PRN
  Administered 2022-09-04 (×2): 50 ug via INTRAVENOUS

## 2022-09-04 MED ORDER — ONDANSETRON HCL 4 MG PO TABS: 4.0000 mg | ORAL_TABLET | Freq: Four times a day (QID) | ORAL | Status: DC | PRN

## 2022-09-04 MED ORDER — DOCUSATE SODIUM 100 MG PO CAPS: 100.0000 mg | ORAL_CAPSULE | Freq: Two times a day (BID) | ORAL | 1 refills | Status: DC | PRN

## 2022-09-04 MED ORDER — PHENYLEPHRINE HCL (PRESSORS) 10 MG/ML IV SOLN
INTRAVENOUS | Status: AC
  Filled 2022-09-04: qty 1

## 2022-09-04 MED ORDER — LISINOPRIL-HYDROCHLOROTHIAZIDE 20-12.5 MG PO TABS: 1.0000 | ORAL_TABLET | Freq: Every day | ORAL | Status: DC

## 2022-09-04 MED ORDER — BUPIVACAINE HCL (PF) 0.25 % IJ SOLN
INTRAMUSCULAR | Status: AC
  Filled 2022-09-04: qty 30

## 2022-09-04 MED ORDER — MAGNESIUM CITRATE PO SOLN: 1.0000 | Freq: Once | ORAL | Status: DC | PRN

## 2022-09-04 MED ORDER — DOCUSATE SODIUM 100 MG PO CAPS
100.0000 mg | ORAL_CAPSULE | Freq: Two times a day (BID) | ORAL | Status: DC
  Administered 2022-09-04 – 2022-09-05 (×3): 100 mg via ORAL
  Filled 2022-09-04 (×3): qty 1

## 2022-09-04 MED ORDER — LISINOPRIL 20 MG PO TABS
20.0000 mg | ORAL_TABLET | Freq: Every day | ORAL | Status: DC
  Administered 2022-09-05: 20 mg via ORAL
  Filled 2022-09-04: qty 1

## 2022-09-04 MED ORDER — PHENYLEPHRINE 80 MCG/ML (10ML) SYRINGE FOR IV PUSH (FOR BLOOD PRESSURE SUPPORT)
PREFILLED_SYRINGE | INTRAVENOUS | Status: AC
  Filled 2022-09-04: qty 10

## 2022-09-04 MED ORDER — POLYETHYLENE GLYCOL 3350 17 G PO PACK: 17.0000 g | PACK | Freq: Every day | ORAL | Status: DC | PRN

## 2022-09-04 MED ORDER — ACETAMINOPHEN 325 MG PO TABS: 325.0000 mg | ORAL_TABLET | Freq: Four times a day (QID) | ORAL | Status: DC | PRN

## 2022-09-04 MED ORDER — ACETAMINOPHEN 500 MG PO TABS
1000.0000 mg | ORAL_TABLET | Freq: Four times a day (QID) | ORAL | Status: AC
  Administered 2022-09-04 – 2022-09-05 (×3): 1000 mg via ORAL
  Filled 2022-09-04 (×4): qty 2

## 2022-09-04 MED ORDER — DEXAMETHASONE SODIUM PHOSPHATE 10 MG/ML IJ SOLN
INTRAMUSCULAR | Status: AC
  Filled 2022-09-04: qty 1

## 2022-09-04 MED ORDER — ALUM & MAG HYDROXIDE-SIMETH 200-200-20 MG/5ML PO SUSP: 30.0000 mL | ORAL | Status: DC | PRN

## 2022-09-04 MED ORDER — MIDAZOLAM HCL 2 MG/2ML IJ SOLN
INTRAMUSCULAR | Status: AC
  Filled 2022-09-04: qty 2

## 2022-09-04 MED ORDER — MENTHOL 3 MG MT LOZG: 1.0000 | LOZENGE | OROMUCOSAL | Status: DC | PRN

## 2022-09-04 MED ORDER — PROMETHAZINE HCL 25 MG/ML IJ SOLN: 6.2500 mg | INTRAMUSCULAR | Status: DC | PRN

## 2022-09-04 MED ORDER — BUPIVACAINE LIPOSOME 1.3 % IJ SUSP
INTRAMUSCULAR | Status: AC
  Filled 2022-09-04: qty 20

## 2022-09-04 MED ORDER — BUPIVACAINE LIPOSOME 1.3 % IJ SUSP
INTRAMUSCULAR | Status: DC | PRN
  Administered 2022-09-04: 20 mL

## 2022-09-04 MED ORDER — ASPIRIN 325 MG PO TBEC
325.0000 mg | DELAYED_RELEASE_TABLET | Freq: Two times a day (BID) | ORAL | Status: DC
  Administered 2022-09-05: 325 mg via ORAL
  Filled 2022-09-04: qty 1

## 2022-09-04 MED ORDER — POLYETHYLENE GLYCOL 3350 17 G PO PACK: 17.0000 g | PACK | Freq: Every day | ORAL | 0 refills | Status: DC

## 2022-09-04 MED ORDER — DEXAMETHASONE SODIUM PHOSPHATE 10 MG/ML IJ SOLN
INTRAMUSCULAR | Status: DC | PRN
  Administered 2022-09-04: 10 mg via INTRAVENOUS

## 2022-09-04 MED ORDER — PHENYLEPHRINE 80 MCG/ML (10ML) SYRINGE FOR IV PUSH (FOR BLOOD PRESSURE SUPPORT)
PREFILLED_SYRINGE | INTRAVENOUS | Status: DC | PRN
  Administered 2022-09-04 (×4): 80 ug via INTRAVENOUS
  Administered 2022-09-04 (×3): 160 ug via INTRAVENOUS

## 2022-09-04 MED ORDER — CHLORHEXIDINE GLUCONATE 0.12 % MT SOLN
15.0000 mL | Freq: Once | OROMUCOSAL | Status: AC
  Administered 2022-09-04: 15 mL via OROMUCOSAL

## 2022-09-04 MED ORDER — OXYCODONE HCL 5 MG PO TABS
5.0000 mg | ORAL_TABLET | ORAL | Status: DC | PRN
  Administered 2022-09-04 – 2022-09-05 (×5): 10 mg via ORAL
  Filled 2022-09-04 (×5): qty 2

## 2022-09-04 SURGICAL SUPPLY — 67 items
ATTUNE MED DOME PAT 41 KNEE (Knees) IMPLANT
ATTUNE PS FEM RT SZ 8 CEM KNEE (Femur) IMPLANT
ATTUNE PSRP INSR SZ8 5 KNEE (Insert) IMPLANT
BAG COUNTER SPONGE SURGICOUNT (BAG) IMPLANT
BAG DECANTER FOR FLEXI CONT (MISCELLANEOUS) ×1 IMPLANT
BAG ZIPLOCK 12X15 (MISCELLANEOUS) IMPLANT
BASE TIBIAL ROT PLAT SZ 8 KNEE (Knees) IMPLANT
BLADE SAW SGTL 11.0X1.19X90.0M (BLADE) ×1 IMPLANT
BLADE SAW SGTL 13.0X1.19X90.0M (BLADE) ×1 IMPLANT
BLADE SURG SZ10 CARB STEEL (BLADE) ×2 IMPLANT
BNDG ELASTIC 4X5.8 VLCR STR LF (GAUZE/BANDAGES/DRESSINGS) ×1 IMPLANT
BNDG ELASTIC 6X5.8 VLCR STR LF (GAUZE/BANDAGES/DRESSINGS) ×1 IMPLANT
BOWL SMART MIX CTS (DISPOSABLE) ×1 IMPLANT
CEMENT HV SMART SET (Cement) ×2 IMPLANT
COVER SURGICAL LIGHT HANDLE (MISCELLANEOUS) ×1 IMPLANT
CUFF TOURN SGL QUICK 34 (TOURNIQUET CUFF) ×1
CUFF TRNQT CYL 34X4.125X (TOURNIQUET CUFF) ×1 IMPLANT
DRAPE INCISE IOBAN 66X45 STRL (DRAPES) IMPLANT
DRAPE ORTHO SPLIT 77X108 STRL (DRAPES) ×2
DRAPE SHEET LG 3/4 BI-LAMINATE (DRAPES) ×1 IMPLANT
DRAPE SURG ORHT 6 SPLT 77X108 (DRAPES) ×2 IMPLANT
DRAPE U-SHAPE 47X51 STRL (DRAPES) ×1 IMPLANT
DRSG AQUACEL AG ADV 3.5X10 (GAUZE/BANDAGES/DRESSINGS) ×1 IMPLANT
DRSG TEGADERM 4X4.75 (GAUZE/BANDAGES/DRESSINGS) IMPLANT
DURAPREP 26ML APPLICATOR (WOUND CARE) ×1 IMPLANT
ELECT BLADE TIP CTD 4 INCH (ELECTRODE) IMPLANT
ELECT REM PT RETURN 15FT ADLT (MISCELLANEOUS) ×1 IMPLANT
EVACUATOR 1/8 PVC DRAIN (DRAIN) IMPLANT
GAUZE SPONGE 2X2 8PLY STRL LF (GAUZE/BANDAGES/DRESSINGS) IMPLANT
GLOVE BIO SURGEON STRL SZ7 (GLOVE) ×1 IMPLANT
GLOVE BIOGEL PI IND STRL 7.0 (GLOVE) ×1 IMPLANT
GLOVE BIOGEL PI IND STRL 8 (GLOVE) ×1 IMPLANT
GLOVE SURG SS PI 8.0 STRL IVOR (GLOVE) ×1 IMPLANT
GOWN STRL REUS W/ TWL XL LVL3 (GOWN DISPOSABLE) ×2 IMPLANT
GOWN STRL REUS W/TWL XL LVL3 (GOWN DISPOSABLE) ×2
HANDPIECE INTERPULSE COAX TIP (DISPOSABLE) ×1
HEMOSTAT SPONGE AVITENE ULTRA (HEMOSTASIS) IMPLANT
HOLDER FOLEY CATH W/STRAP (MISCELLANEOUS) IMPLANT
IMMOBILIZER KNEE 20 (SOFTGOODS) ×1
IMMOBILIZER KNEE 20 THIGH 36 (SOFTGOODS) ×1 IMPLANT
KIT TURNOVER KIT A (KITS) IMPLANT
MANIFOLD NEPTUNE II (INSTRUMENTS) ×1 IMPLANT
NS IRRIG 1000ML POUR BTL (IV SOLUTION) IMPLANT
PACK TOTAL KNEE CUSTOM (KITS) ×1 IMPLANT
PIN STEINMAN FIXATION KNEE (PIN) IMPLANT
PROTECTOR NERVE ULNAR (MISCELLANEOUS) ×1 IMPLANT
SAW OSC TIP CART 19.5X105X1.3 (SAW) IMPLANT
SEALER BIPOLAR AQUA 6.0 (INSTRUMENTS) IMPLANT
SET HNDPC FAN SPRY TIP SCT (DISPOSABLE) ×1 IMPLANT
SOLUTION PRONTOSAN WOUND 350ML (IRRIGATION / IRRIGATOR) ×1 IMPLANT
SPIKE FLUID TRANSFER (MISCELLANEOUS) ×1 IMPLANT
STAPLER VISISTAT (STAPLE) IMPLANT
STRIP CLOSURE SKIN 1/2X4 (GAUZE/BANDAGES/DRESSINGS) IMPLANT
SUT BONE WAX W31G (SUTURE) ×1 IMPLANT
SUT MNCRL AB 4-0 PS2 18 (SUTURE) IMPLANT
SUT STRATAFIX 0 PDS 27 VIOLET (SUTURE) ×1
SUT VIC AB 1 CT1 27 (SUTURE) ×5
SUT VIC AB 1 CT1 27XBRD ANTBC (SUTURE) ×3 IMPLANT
SUT VIC AB 2-0 CT1 27 (SUTURE) ×3
SUT VIC AB 2-0 CT1 TAPERPNT 27 (SUTURE) ×3 IMPLANT
SUTURE STRATFX 0 PDS 27 VIOLET (SUTURE) ×1 IMPLANT
SYR 3ML LL SCALE MARK (SYRINGE) IMPLANT
TIBIAL BASE ROT PLAT SZ 8 KNEE (Knees) ×1 IMPLANT
TRAY FOLEY MTR SLVR 16FR STAT (SET/KITS/TRAYS/PACK) ×1 IMPLANT
WATER STERILE IRR 1000ML POUR (IV SOLUTION) ×1 IMPLANT
WIPE CHG 2% 2PK PREOPERATIVE (MISCELLANEOUS) ×1 IMPLANT
WRAP KNEE MAXI GEL POST OP (GAUZE/BANDAGES/DRESSINGS) ×1 IMPLANT

## 2022-09-04 NOTE — Transfer of Care (Signed)
Immediate Anesthesia Transfer of Care Note  Patient: Gordon Christian  Procedure(s) Performed: TOTAL KNEE ARTHROPLASTY (Right: Knee)  Patient Location: PACU  Anesthesia Type:General  Level of Consciousness: awake, alert , and oriented  Airway & Oxygen Therapy: Patient Spontanous Breathing and Patient connected to face mask oxygen  Post-op Assessment: Report given to RN and Post -op Vital signs reviewed and stable  Post vital signs: Reviewed and stable  Last Vitals:  Vitals Value Taken Time  BP 99/62 09/04/22 1116  Temp    Pulse 68 09/04/22 1120  Resp 18 09/04/22 1120  SpO2 95 % 09/04/22 1120  Vitals shown include unvalidated device data.  Last Pain:  Vitals:   09/04/22 0639  TempSrc:   PainSc: 0-No pain         Complications: No notable events documented.

## 2022-09-04 NOTE — Discharge Instructions (Signed)

## 2022-09-04 NOTE — Anesthesia Procedure Notes (Signed)
Spinal  Patient location during procedure: OR Start time: 09/04/2022 8:33 AM End time: 09/04/2022 8:36 AM Reason for block: surgical anesthesia Staffing Performed: anesthesiologist  Anesthesiologist: [name], MD Performed by: [name], MD Authorized by: [name], MD   Preanesthetic Checklist Completed: patient identified, IV checked, risks and benefits discussed, surgical consent, monitors and equipment checked, pre-op evaluation and timeout performed Spinal Block Patient position: sitting Prep: DuraPrep Patient monitoring: heart rate, cardiac monitor, continuous pulse ox and blood pressure Approach: midline Location: L3-4 Injection technique: single-shot Needle Needle type: Pencan  Needle gauge: 24 G Additional Notes Consent was obtained prior to the procedure with all questions answered and concerns addressed. Risks including, but not limited to, bleeding, infection, nerve damage, paralysis, failed block, inadequate analgesia, allergic reaction, high spinal, itching, and headache were discussed and the patient wished to proceed. Functioning IV was confirmed and monitors were applied. Sterile prep and drape, including hand hygiene, mask, and sterile gloves were used. The patient was positioned and the spine was prepped. The skin was anesthetized with lidocaine. Free flow of clear CSF was obtained prior to injecting local anesthetic into the CSF. The spinal needle aspirated freely following injection. The needle was carefully withdrawn. The patient tolerated the procedure well.   Leslye Peer, MD

## 2022-09-04 NOTE — Op Note (Unsigned)
NAMEKRISTON, PASQUARELLO MEDICAL RECORD NO: [id number] ACCOUNT NO: [id number] DATE OF BIRTH: 10/21/1960 FACILITY: [facility]ATION: [id number] PHYSICIAN: Javier Docker, MD  Operative Report   DATE OF PROCEDURE: 09/04/2022  PREOPERATIVE DIAGNOSES: 1.  End-stage osteoarthrosis valgus deformity, right knee. 2.  Retained hardware, status post ACL reconstruction.  POSTOPERATIVE DIAGNOSES: 1.  End-stage osteoarthrosis valgus deformity, right knee. 2.  Retained hardware, status post ACL reconstruction.  PROCEDURE PERFORMED:   1.  Right total knee replacement utilizing DePuy Attune rotating platform 8 femur, 8 tibia, 5 mm insert, 41 patella. 2.  Removal of hardware retained following ACL reconstruction.  ANESTHESIA:  Spinal.  ASSISTANT:  Andrez Grime, PA.  HISTORY:  This is a 61 year old male who has had a history of ACL reconstruction of the right knee and has had progressive posttraumatic degenerative arthritis of the knee developing a valgus deformity.  He was indicated for replacement of the  degenerated joint.  Risks and benefits discussed including bleeding, infection, damage to neurovascular structures, no change in symptoms, worsening symptoms, DVT, PE, anesthetic complications, etc.  DESCRIPTION OF PROCEDURE:  With the patient in supine position, after induction of adequate spinal anesthesia and 2 grams Kefzol the right lower extremity was prepped and draped and exsanguinated in usual sterile fashion.  Thigh tourniquet inflated to  225 mmHg.  Midline incision was then made over the knee.  Full thickness flaps developed.  Median parapatellar arthrotomy was then performed.  Fat pad debrided.  Elevated soft tissues medially, preserving the MCL.  Patella was gently everted, knee was  flexed.  Tricompartmental osteoarthrosis was noted, particularly in the lateral compartment, patellofemoral joint.  Osteophytes were removed with a rongeur. Leksell rongeur, which was utilized to remove the remnants of  the menisci and the ACL.  I placed  a notch above the femoral notch as a starting place for the femoral drill.  This was drilled in line of the femur to enter the femoral canal without difficulty.  There was sclerotic bone.  It was irrigated.  T-handle was utilized within the femoral  canal.  I used a 6-degree right 9 off the distal femur with external alignment guide, this was pinned.  I performed a distal femoral cut.  I then sized off the anterior cortex of the femur to be an 8 and 3 degrees of external rotation.  This was then  pinned.  I placed a distal femoral cutting block.  I performed anterior, posterior and chamfer cuts with soft tissues protected posteriorly at all times.  I then used the curved Crego to relax the capsule posterior lateral capsule off the femur.  It was  fairly tight.  Popliteus was intact.  I then subluxed the tibia.  The retained hardware was from the tibial canal.  There was a metal button externally.  With the bursa overlying that the bursa was excised.  I removed the patellar button and with some  retained Ethibond suture.  This area was irrigated.  Following this, the low side was posterolaterally.  I selected a 3 off the medial side.  External alignment guide parallel to the shaft bisecting the tibiotalar joint 3-degree slope.  This was pinned.   I performed a proximal tibial cut.  Following this, site using extension block of 5 followed by 6, 6 was slightly tight.  There was an equivalent extension and flexion block gap when tested.  Reflexed the knee, subluxed the tibia and measured to an 8,  placed our trial tibia just the medial aspect of  tibial tubercle maximizing our surface.  This was then pinned.  Following this, I harvested bone centrally and used it for the femoral bone plug, which was impacted into place.  I then used an oscillating  saw, Beyer rongeur to decorticate some of the medial sclerotic bone.  I drilled centrally in the tibia and then impacted our tibial  fin.  Following this, we used a box cut jig over the femur, bisecting the canal.  This was then pinned flush to the femur.   I then performed our box cut utilizing an oscillating saw, followed by a rasp and a Beyer rongeur.  A trial femur was then impacted and we had a good fit flush.  I drilled our lug holes.  I placed a 6 mm insert, reduced it.  There was slight tightness  in extension and flexion, I everted the patella, measured it to 24, planed it to a 15 utilizing the external patellar jig.  It was sized between a 38 and a 41.  The 41 had better coverage, the 38 was under cover.  I medialized the peg holes with a paddle  parallel to the joint surface.  I drilled our peg holes and placed a trial 41, reduced it had excellent patellofemoral tracking.  Next, all instrumentation was then removed.  The knee was flexed.  We checked posteriorly.  Remnants of menisci removed.   There was a loose bodies were removed posteriorly as well.  The popliteus was intact.  Residual menisci remnants were excised.  I cauterized the geniculates.  I then used Exparel and injected it posteromedially through the capsule aspirating without  breaking the vacuum and injecting approximately 20 mL also the proximal medial tibia, quadriceps tendon, the periosteum of the femur and the femoral condyle.  Next pulsatile lavage thoroughly cleaned all bony surfaces.  Knee was flexed.  All thoroughly  dried.  Tibia subluxed.  Cement was mixed on the back table under vacuum.  I then put drill holes in the medial tibial plateau due to some sclerosis.  Cement was placed on the tibial canal, digitally pressurizing it.  Cement was placed on the tibial  component and impacted into place.  Redundant cement removed.  Pressure was held on the tibial tray while we inserted the femur.  It did not past 90 degrees.  Cement was placed on the distal femur as well as the femoral component, it was impacted into  place.  Redundant cement removed.  I placed a  5 trial insert, reduced the knee to full extension and held in axial load throughout the curing of the cement.  Redundant cement removed.  We cemented and clamped the patella.  Marcaine with epinephrine was  placed in the wound as well as Prontosan during the curing of the cement.  Following the curing of the cement, tourniquet was deflated at 62 minutes.  Any mild bleeding was cauterized.  Again, had full flexion, full extension, good stability to varus  valgus stressing at 0-30 degrees, negative anterior drawer and excellent patellofemoral tracking.  The 5 trial was then removed and we meticulously removed all redundant cement.  Copiously irrigated the wound.  Cauterized any minor bleeding.  Prontosan  placed in the wound.  I placed a 5 permanent insert, reduced it, had full extension and full flexion, good stability with varus and valgus stressing at 0-30 degrees, negative anterior drawer.  Excellent patellofemoral tracking.  Following this, in slight  flexion I reapproximated the patellar arthrotomy with a towel clip  and then with #1 Vicryl in interrupted figure-of-eight sutures and then oversewn with a running Stratafix.  Following this, I had full extension and full flexion excellent patellofemoral  tracking and good stability.  Copiously irrigated subcutaneous tissues with a pulsatile lavage, as it did prior to placing the cement cleaning all bony surfaces and after placing her insert, subcutaneous with 2-0 and skin with staples irrigating with  Prontosan throughout.  He had flexion to gravity at 90 degrees.  Next, a sterile dressing applied, placed in immobilizer and transported to the recovery room in satisfactory condition.  The patient tolerated the procedure well.  No complications.  ASSISTANT:  Andrez Grime, PA.  BLOOD LOSS:  50 mL.  PA was used throughout the case for patient positioning, exposure and closure.   PUS D: 09/04/2022 11:12:07 am T: 09/04/2022 2:17:00 pm  JOB: [id number]/  [id number]

## 2022-09-04 NOTE — Brief Op Note (Signed)
09/04/2022  8:24 AM  PATIENT:  Dennis Bast  61 y.o. male  PRE-OPERATIVE DIAGNOSIS:  Right knee degenerative joint disease  POST-OPERATIVE DIAGNOSIS:  * No post-op diagnosis entered *  PROCEDURE:  Procedure(s): TOTAL KNEE ARTHROPLASTY (Right)  SURGEON:  Surgeon(s) and Role:    Jene Every, MD - Primary  PHYSICIAN ASSISTANT:   ASSISTANTS: Bissell   ANESTHESIA:   spinal  EBL:  50     BLOOD ADMINISTERED:none  DRAINS: none   LOCAL MEDICATIONS USED:  MARCAINE     SPECIMEN:  No Specimen  DISPOSITION OF SPECIMEN:  N/A  COUNTS:  YES  TOURNIQUET:  * Missing tourniquet times found for documented tourniquets in log: [id number] *  DICTATION: .Other Dictation: Dictation Number   [id number]  PLAN OF CARE: Admit for overnight observation  PATIENT DISPOSITION:  PACU - hemodynamically stable.   Delay start of Pharmacological VTE agent (>24hrs) due to surgical blood loss or risk of bleeding: no

## 2022-09-04 NOTE — Plan of Care (Signed)
  Problem: Nutritional: Goal: Maintenance of adequate nutrition will improve Outcome: Adequate for Discharge   Problem: Activity: Goal: Ability to avoid complications of mobility impairment will improve Outcome: Progressing   Problem: Pain Management: Goal: Pain level will decrease with appropriate interventions Outcome: Progressing

## 2022-09-04 NOTE — Evaluation (Signed)
Physical Therapy Evaluation Patient Details Name: Gordon Christian MRN: [id number] DOB: 1961-06-18 Today's Date: 09/04/2022  History of Present Illness  Pt is a 61yo male presenting s/p R-TKA on 09/04/22. PMH: DM, hx of CVA,  Clinical Impression  Chick Cousins is a 61 y.o. male POD 0 s/p R-TKA. Patient reports independence with mobility at baseline. Patient is now limited by functional impairments (see PT problem list below) and demonstrated modified independence for bed mobility and min guard for transfers. Patient was able to ambulate 80 feet with RW and min guard level of assist. Patient instructed in exercise to facilitate ROM and circulation to manage edema. Provided incentive spirometer and with Vcs pt able to achieve . Patient will benefit from continued skilled PT interventions to address impairments and progress towards PLOF. Acute PT will follow to progress mobility and stair training in preparation for safe discharge home.       Recommendations for follow up therapy are one component of a multi-disciplinary discharge planning process, led by the attending physician.  Recommendations may be updated based on patient status, additional functional criteria and insurance authorization.  Follow Up Recommendations Follow physician's recommendations for discharge plan and follow up therapies      Assistance Recommended at Discharge Frequent or constant Supervision/Assistance  Patient can return home with the following  A little help with bathing/dressing/bathroom;A little help with walking and/or transfers;Assistance with cooking/housework;Assist for transportation;Help with stairs or ramp for entrance    Equipment Recommendations Rolling walker (2 wheels)  Recommendations for Other Services       Functional Status Assessment Patient has had a recent decline in their functional status and demonstrates the ability to make significant improvements in function in a reasonable and predictable  amount of time.     Precautions / Restrictions Precautions Precautions: Fall Precaution Comments: no pillow under the knee Restrictions Weight Bearing Restrictions: No Other Position/Activity Restrictions: wbat      Mobility  Bed Mobility Overal bed mobility: Modified Independent             General bed mobility comments: increased time    Transfers Overall transfer level: Needs assistance Equipment used: Rolling walker (2 wheels) Transfers: Sit to/from Stand Sit to Stand: Min guard           General transfer comment: For safety only, VCs for sequencing/ .    Ambulation/Gait Ambulation/Gait assistance: Min guard, +2 safety/equipment Gait Distance (Feet): 80 Feet Assistive device: Rolling walker (2 wheels) Gait Pattern/deviations: Step-to pattern Gait velocity: decreased     General Gait Details: Pt ambulated with RW and min guard +2 for safety only with recliner follow, no physical assist required or overt LOB noted.  Stairs            Wheelchair Mobility    Modified Rankin (Stroke Patients Only)       Balance Overall balance assessment: Needs assistance Sitting-balance support: Feet supported, No upper extremity supported Sitting balance-Leahy Scale: Good     Standing balance support: Reliant on assistive device for balance, During functional activity, Bilateral upper extremity supported Standing balance-Leahy Scale: Poor                               Pertinent Vitals/Pain Pain Assessment Pain Assessment: 0-10 Pain Score: 4  Pain Location: right knee Pain Descriptors / Indicators: Operative site guarding Pain Intervention(s): Limited activity within patient's tolerance, Monitored during session, Repositioned, Ice applied, Patient requesting pain meds-RN notified  Home Living Family/patient expects to be discharged to:: Private residence Living Arrangements: Spouse/significant other Available Help at Discharge:  Friend(s);Available 24 hours/day Kathi Simpers) Type of Home: House Home Access: Level entry       Home Layout: One level;Other (Comment) (two steps to get from living room to the shower/bedroom, no railing) Home Equipment: None      Prior Function Prior Level of Function : Independent/Modified Independent;Driving             Mobility Comments: IND ADLs Comments: IND     Hand Dominance        Extremity/Trunk Assessment   Upper Extremity Assessment Upper Extremity Assessment: Overall WFL for tasks assessed    Lower Extremity Assessment Lower Extremity Assessment: RLE deficits/detail;LLE deficits/detail RLE Deficits / Details: MMT ank DF/PF 5/5, no extensor lag noted RLE Sensation: WNL LLE Deficits / Details: MMT ank DF/PF 5/5 LLE Sensation: WNL    Cervical / Trunk Assessment Cervical / Trunk Assessment: Normal  Communication   Communication: No difficulties  Cognition Arousal/Alertness: Awake/alert Behavior During Therapy: WFL for tasks assessed/performed Overall Cognitive Status: Within Functional Limits for tasks assessed                                          General Comments General comments (skin integrity, edema, etc.): Kathi Simpers present    Exercises Total Joint Exercises Ankle Circles/Pumps: AROM, Both, 20 reps   Assessment/Plan    PT Assessment Patient needs continued PT services  PT Problem List Decreased strength;Decreased range of motion;Decreased activity tolerance;Decreased balance;Decreased mobility;Decreased coordination;Pain       PT Treatment Interventions DME instruction;Gait training;Stair training;Functional mobility training;Therapeutic activities;Therapeutic exercise;Balance training;Neuromuscular re-education;Patient/family education    PT Goals (Current goals can be found in the Care Plan section)  Acute Rehab PT Goals Patient Stated Goal: To go home PT Goal Formulation: With patient Time For Goal  Achievement: 09/11/22 Potential to Achieve Goals: Good    Frequency 7X/week     Co-evaluation               AM-PAC PT "6 Clicks" Mobility  Outcome Measure Help needed turning from your back to your side while in a flat bed without using bedrails?: None Help needed moving from lying on your back to sitting on the side of a flat bed without using bedrails?: A Little Help needed moving to and from a bed to a chair (including a wheelchair)?: A Little Help needed standing up from a chair using your arms (e.g., wheelchair or bedside chair)?: A Little Help needed to walk in hospital room?: A Little Help needed climbing 3-5 steps with a railing? : A Little 6 Click Score: 19    End of Session Equipment Utilized During Treatment: Gait belt Activity Tolerance: Patient tolerated treatment well;No increased pain Patient left: in chair;with call bell/phone within reach;with chair alarm set;with family/visitor present;with SCD's reapplied Nurse Communication: Mobility status PT Visit Diagnosis: Pain;Difficulty in walking, not elsewhere classified (R26.2) Pain - Right/Left: Right Pain - part of body: Knee    Time: 8466-5993 PT Time Calculation (min) (ACUTE ONLY): 30 min   Charges:   PT Evaluation $PT Eval Low Complexity: 1 Low PT Treatments $Gait Training: 8-22 mins        Jamesetta Geralds, PT, DPT [facility] Rehabilitation Department Office: [phone] Weekend pager: [phone]  Jamesetta Geralds 09/04/2022, 4:14 PM

## 2022-09-04 NOTE — Anesthesia Procedure Notes (Addendum)
Anesthesia Regional Block: Adductor canal block   Pre-Anesthetic Checklist: , timeout performed,  Correct Patient, Correct Site, Correct Laterality,  Correct Procedure, Correct Position, site marked,  Risks and benefits discussed,  Surgical consent,  Pre-op evaluation,  At surgeon's request and post-op pain management  Laterality: Right  Prep: chloraprep       Needles:  Injection technique: Single-shot  Needle Type: Echogenic Needle     Needle Length: 10cm  Needle Gauge: 21     Additional Needles:   Narrative:  Start time: 09/04/2022 8:02 AM End time: 09/04/2022 8:05 AM Injection made incrementally with aspirations every 5 mL.  Performed by: Personally  Anesthesiologist: [name], MD  Additional Notes: No pain on injection. No increased resistance to injection. Injection made in 5cc increments. Good needle visualization. Patient tolerated the procedure well.

## 2022-09-04 NOTE — Anesthesia Postprocedure Evaluation (Signed)
Anesthesia Post Note  Patient: Gordon Christian  Procedure(s) Performed: TOTAL KNEE ARTHROPLASTY (Right: Knee)     Patient location during evaluation: PACU Anesthesia Type: Spinal Level of consciousness: awake and alert Pain management: pain level controlled Vital Signs Assessment: post-procedure vital signs reviewed and stable Respiratory status: spontaneous breathing and respiratory function stable Cardiovascular status: blood pressure returned to baseline and stable Postop Assessment: spinal receding and no apparent nausea or vomiting Anesthetic complications: no   No notable events documented.  Last Vitals:  Vitals:   09/04/22 1215 09/04/22 1232  BP: 109/72 110/76  Pulse: 64 66  Resp: 11 18  Temp: 36.7 C (!) 36.4 C  SpO2: 99% 94%    Last Pain:  Vitals:   09/04/22 1232  TempSrc: Oral  PainSc:                  [name]

## 2022-09-04 NOTE — Interval H&P Note (Signed)
History and Physical Interval Note:  09/04/2022 8:24 AM  Gordon Christian  has presented today for surgery, with the diagnosis of Right knee degenerative joint disease.  The various methods of treatment have been discussed with the patient and family. After consideration of risks, benefits and other options for treatment, the patient has consented to  Procedure(s): TOTAL KNEE ARTHROPLASTY (Right) as a surgical intervention.  The patient's history has been reviewed, patient examined, no change in status, stable for surgery.  I have reviewed the patient's chart and labs.  Questions were answered to the patient's satisfaction.     Javier Docker

## 2022-09-05 ENCOUNTER — Encounter (HOSPITAL_COMMUNITY): Payer: Self-pay | Admitting: Specialist

## 2022-09-05 DIAGNOSIS — M1711 Unilateral primary osteoarthritis, right knee: Secondary | ICD-10-CM | POA: Diagnosis not present

## 2022-09-05 LAB — BASIC METABOLIC PANEL
Anion gap: 11 (ref 5–15)
BUN: 30 mg/dL — ABNORMAL HIGH (ref 8–23)
CO2: 22 mmol/L (ref 22–32)
Calcium: 8.3 mg/dL — ABNORMAL LOW (ref 8.9–10.3)
Chloride: 103 mmol/L (ref 98–111)
Creatinine, Ser: 1.78 mg/dL — ABNORMAL HIGH (ref 0.61–1.24)
GFR, Estimated: 42 mL/min — ABNORMAL LOW (ref >60)
Glucose, Bld: 165 mg/dL — ABNORMAL HIGH (ref 70–99)
Potassium: 4 mmol/L (ref 3.5–5.1)
Sodium: 136 mmol/L (ref 135–145)

## 2022-09-05 LAB — GLUCOSE, CAPILLARY
Glucose-Capillary: 163 mg/dL — ABNORMAL HIGH (ref 70–99)
Glucose-Capillary: 140 mg/dL — ABNORMAL HIGH (ref 70–99)

## 2022-09-05 LAB — CBC
HCT: 37 % — ABNORMAL LOW (ref 39.0–52.0)
Hemoglobin: 12.5 g/dL — ABNORMAL LOW (ref 13.0–17.0)
MCH: 30.9 pg (ref 26.0–34.0)
MCHC: 33.8 g/dL (ref 30.0–36.0)
MCV: 91.4 fL (ref 80.0–100.0)
Platelets: 121 10*3/uL — ABNORMAL LOW (ref 150–400)
RBC: 4.05 MIL/uL — ABNORMAL LOW (ref 4.22–5.81)
RDW: 12.1 % (ref 11.5–15.5)
WBC: 10.8 10*3/uL — ABNORMAL HIGH (ref 4.0–10.5)
nRBC: 0 % (ref 0.0–0.2)

## 2022-09-05 NOTE — Progress Notes (Signed)
Subjective: 1 Day Post-Op Procedure(s) (LRB): TOTAL KNEE ARTHROPLASTY (Right) Patient reports pain as 4 on 0-10 scale.   Denies CP or SOB.  Voiding without difficulty. Positive flatus. Objective: Vital signs in last 24 hours: Temp:  [97.5 F (36.4 C)-98.5 F (36.9 C)] 97.6 F (36.4 C) (12/07 0541) Pulse Rate:  [63-80] 67 (12/07 0541) Resp:  [10-18] 18 (12/07 0541) BP: (97-121)/(61-77) 99/61 (12/07 0541) SpO2:  [91 %-100 %] 95 % (12/07 0541)  Intake/Output from previous day: 12/06 0701 - 12/07 0700 In: 3732.4 [P.O.:460; I.V.:3072.4; IV Piggyback:200] Out: 2425 [Urine:2375; Blood:50] Intake/Output this shift: No intake/output data recorded.  Recent Labs    09/05/22 0343  HGB 12.5*   Recent Labs    09/05/22 0343  WBC 10.8*  RBC 4.05*  HCT 37.0*  PLT 121*   Recent Labs    09/05/22 0343  NA 136  K 4.0  CL 103  CO2 22  BUN 30*  CREATININE 1.78*  GLUCOSE 165*  CALCIUM 8.3*   No results for input(s): "LABPT", "INR" in the last 72 hours.  Neurologically intact Neurovascular intact Sensation intact distally Intact pulses distally Dorsiflexion/Plantar flexion intact Incision: dressing C/D/I and no drainage Compartment soft  Assessment/Plan:  1 Day Post-Op Procedure(s) (LRB): TOTAL KNEE ARTHROPLASTY (Right) Advance diet Up with therapy D/C IV fluids Plan for discharge tomorrow Discharge home with home health   Principal Problem:   Right knee DJD      Javier Docker 09/05/2022, @[facility] 

## 2022-09-05 NOTE — Progress Notes (Signed)
Physical Therapy Treatment Patient Details Name: Gordon Christian MRN: [id number] DOB: [birth year] Today's Date: 09/05/2022   History of Present Illness Pt is a 61yo male presenting s/p R-TKA on 09/04/22. PMH: DM, hx of CVA,    PT Comments    Pt is progressing well with mobility, he met PT goals and is ready to DC home from a PT standpoint. He ambulated 160' with RW, completed stair training, and demonstrates good understanding of TKA HEP.     Recommendations for follow up therapy are one component of a multi-disciplinary discharge planning process, led by the attending physician.  Recommendations may be updated based on patient status, additional functional criteria and insurance authorization.  Follow Up Recommendations  Follow physician's recommendations for discharge plan and follow up therapies     Assistance Recommended at Discharge Frequent or constant Supervision/Assistance  Patient can return home with the following A little help with bathing/dressing/bathroom;A little help with walking and/or transfers;Assistance with cooking/housework;Assist for transportation;Help with stairs or ramp for entrance   Equipment Recommendations  Rolling walker (2 wheels)    Recommendations for Other Services       Precautions / Restrictions Precautions Precautions: Fall;Knee Precaution Booklet Issued: Yes (comment) Precaution Comments: reviewed no pillow under the knee Restrictions Weight Bearing Restrictions: No Other Position/Activity Restrictions: wbat     Mobility  Bed Mobility Overal bed mobility: Modified Independent             General bed mobility comments: HOB up    Transfers Overall transfer level: Modified independent Equipment used: Rolling walker (2 wheels) Transfers: Sit to/from Stand Sit to Stand: Modified independent (Device/Increase time)                Ambulation/Gait Ambulation/Gait assistance: Modified independent (Device/Increase time) Gait  Distance (Feet): 160 Feet Assistive device: Rolling walker (2 wheels) Gait Pattern/deviations: Step-to pattern Gait velocity: decreased     General Gait Details: steady, no loss of balance, good sequencing   Stairs Stairs: Yes Stairs assistance: Min assist Stair Management: No rails, Backwards, Step to pattern, With walker Number of Stairs: 2 General stair comments: pt has 2 steps down to den, no rails. VCs for sequencing, min A to manage RW   Wheelchair Mobility    Modified Rankin (Stroke Patients Only)       Balance Overall balance assessment: Needs assistance Sitting-balance support: Feet supported, No upper extremity supported Sitting balance-Leahy Scale: Good     Standing balance support: Reliant on assistive device for balance, During functional activity, Bilateral upper extremity supported Standing balance-Leahy Scale: Poor                              Cognition Arousal/Alertness: Awake/alert Behavior During Therapy: WFL for tasks assessed/performed Overall Cognitive Status: Within Functional Limits for tasks assessed                                          Exercises Total Joint Exercises Ankle Circles/Pumps: AROM, Both, 20 reps Quad Sets: AROM, Right, 5 reps, Supine Short Arc Quad: AROM, Right, 5 reps, Supine Heel Slides: AAROM, Right, 5 reps, Supine Hip ABduction/ADduction: AROM, Right, 5 reps, Supine Straight Leg Raises: AROM, Right, 5 reps, Supine Long Arc Quad: AROM, Right, 5 reps, Seated Knee Flexion: AAROM, Right, 10 reps, Seated Goniometric ROM: R knee AAROM ~0-80*    General Comments  Pertinent Vitals/Pain Pain Assessment Pain Score: 5  Pain Location: right knee Pain Descriptors / Indicators: Operative site guarding, Sore Pain Intervention(s): Limited activity within patient's tolerance, Monitored during session, Premedicated before session, RN gave pain meds during session, Ice applied    Home Living                           Prior Function            PT Goals (current goals can now be found in the care plan section) Acute Rehab PT Goals Patient Stated Goal: return to competetive pickleball PT Goal Formulation: With patient Time For Goal Achievement: 09/11/22 Potential to Achieve Goals: Good Progress towards PT goals: Progressing toward goals    Frequency    7X/week      PT Plan Current plan remains appropriate    Co-evaluation              AM-PAC PT "6 Clicks" Mobility   Outcome Measure  Help needed turning from your back to your side while in a flat bed without using bedrails?: None Help needed moving from lying on your back to sitting on the side of a flat bed without using bedrails?: None Help needed moving to and from a bed to a chair (including a wheelchair)?: None Help needed standing up from a chair using your arms (e.g., wheelchair or bedside chair)?: None Help needed to walk in hospital room?: None Help needed climbing 3-5 steps with a railing? : A Little 6 Click Score: 23    End of Session Equipment Utilized During Treatment: Gait belt Activity Tolerance: Patient tolerated treatment well Patient left: in chair;with call bell/phone within reach;with chair alarm set Nurse Communication: Mobility status PT Visit Diagnosis: Pain;Difficulty in walking, not elsewhere classified (R26.2) Pain - Right/Left: Right Pain - part of body: Knee     Time: 3567-0141 PT Time Calculation (min) (ACUTE ONLY): 40 min  Charges:  $Gait Training: 8-22 mins $Therapeutic Exercise: 8-22 mins $Therapeutic Activity: 8-22 mins                     Blondell Reveal Kistler PT 09/05/2022  Acute Rehabilitation Services  Office [phone]

## 2022-09-05 NOTE — TOC Transition Note (Signed)
Transition of Care [facility]) - CM/SW Discharge Note  Patient Details  Name: Gordon Christian MRN: [id number] Date of Birth: 01-03-1961  [facility]) CM/SW Contact:  Sherie Don, LCSW Phone Number: 09/05/2022, 10:31 AM  Clinical Narrative: Patient is expected to discharge home after working with PT. CSW met with patient to confirm discharge plan and needs. Patient will go home with HHPT, which was prearranged with [facility]. Patient will need a rolling walker, which was delivered to patient's room by [employer]. TOC signing off.  Final next level of care: [place] Barriers to Discharge: No Barriers Identified  Patient Goals and CMS Choice Patient states their goals for this hospitalization and ongoing recovery are:: Discharge home with [place] CMS Medicare.gov Compare Post Acute Care list provided to:: Patient Choice offered to / list presented to : Patient  Discharge Plan and Services         DME Arranged: Walker rolling DME Agency: [employer] Date DME Agency Contacted: 09/05/22 Representative spoke with at DME Agency: [place]: PT [place]: [place] Representative spoke with at [place]: Prearranged in orthopedist's office  Social Determinants of Health (SDOH) Interventions    Readmission Risk Interventions     No data to display

## 2022-09-05 NOTE — Plan of Care (Signed)
Discharge instructions given to the patient including medications and follow up.  

## 2023-03-22 ENCOUNTER — Inpatient Hospital Stay (HOSPITAL_COMMUNITY)
Admission: EM | Admit: 2023-03-22 | Discharge: 2023-03-25 | DRG: 871 | Disposition: A | Discharge status: Home / Self Care | Payer: Medicare Other | Attending: Family Medicine | Admitting: Family Medicine

## 2023-03-22 ENCOUNTER — Encounter (HOSPITAL_COMMUNITY): Payer: Self-pay | Admitting: Emergency Medicine

## 2023-03-22 ENCOUNTER — Emergency Department (HOSPITAL_COMMUNITY): Payer: Medicare Other

## 2023-03-22 ENCOUNTER — Other Ambulatory Visit: Payer: Self-pay

## 2023-03-22 ENCOUNTER — Emergency Department (HOSPITAL_COMMUNITY)
Admission: EM | Admit: 2023-03-22 | Discharge: 2023-03-22 | Disposition: A | Discharge status: Home / Self Care | Payer: Medicare Other | Source: Home / Self Care | Attending: Emergency Medicine | Admitting: Emergency Medicine

## 2023-03-22 DIAGNOSIS — I10 Essential (primary) hypertension: Secondary | ICD-10-CM | POA: Insufficient documentation

## 2023-03-22 DIAGNOSIS — B9689 Other specified bacterial agents as the cause of diseases classified elsewhere: Secondary | ICD-10-CM | POA: Insufficient documentation

## 2023-03-22 DIAGNOSIS — Z87442 Personal history of urinary calculi: Secondary | ICD-10-CM

## 2023-03-22 DIAGNOSIS — Z7984 Long term (current) use of oral hypoglycemic drugs: Secondary | ICD-10-CM | POA: Insufficient documentation

## 2023-03-22 DIAGNOSIS — Z8249 Family history of ischemic heart disease and other diseases of the circulatory system: Secondary | ICD-10-CM

## 2023-03-22 DIAGNOSIS — E119 Type 2 diabetes mellitus without complications: Secondary | ICD-10-CM | POA: Insufficient documentation

## 2023-03-22 DIAGNOSIS — R569 Unspecified convulsions: Secondary | ICD-10-CM | POA: Diagnosis present

## 2023-03-22 DIAGNOSIS — Z9049 Acquired absence of other specified parts of digestive tract: Secondary | ICD-10-CM

## 2023-03-22 DIAGNOSIS — Z7982 Long term (current) use of aspirin: Secondary | ICD-10-CM

## 2023-03-22 DIAGNOSIS — E1122 Type 2 diabetes mellitus with diabetic chronic kidney disease: Secondary | ICD-10-CM | POA: Diagnosis present

## 2023-03-22 DIAGNOSIS — K5730 Diverticulosis of large intestine without perforation or abscess without bleeding: Secondary | ICD-10-CM | POA: Diagnosis present

## 2023-03-22 DIAGNOSIS — N390 Urinary tract infection, site not specified: Secondary | ICD-10-CM

## 2023-03-22 DIAGNOSIS — G4700 Insomnia, unspecified: Secondary | ICD-10-CM | POA: Diagnosis present

## 2023-03-22 DIAGNOSIS — N200 Calculus of kidney: Secondary | ICD-10-CM | POA: Diagnosis present

## 2023-03-22 DIAGNOSIS — N1832 Chronic kidney disease, stage 3b: Secondary | ICD-10-CM | POA: Diagnosis present

## 2023-03-22 DIAGNOSIS — R6521 Severe sepsis with septic shock: Secondary | ICD-10-CM | POA: Diagnosis present

## 2023-03-22 DIAGNOSIS — G8929 Other chronic pain: Secondary | ICD-10-CM | POA: Insufficient documentation

## 2023-03-22 DIAGNOSIS — E8729 Other acidosis: Secondary | ICD-10-CM | POA: Insufficient documentation

## 2023-03-22 DIAGNOSIS — I69354 Hemiplegia and hemiparesis following cerebral infarction affecting left non-dominant side: Secondary | ICD-10-CM

## 2023-03-22 DIAGNOSIS — G894 Chronic pain syndrome: Secondary | ICD-10-CM | POA: Diagnosis present

## 2023-03-22 DIAGNOSIS — E785 Hyperlipidemia, unspecified: Secondary | ICD-10-CM | POA: Insufficient documentation

## 2023-03-22 DIAGNOSIS — Z7985 Long-term (current) use of injectable non-insulin antidiabetic drugs: Secondary | ICD-10-CM

## 2023-03-22 DIAGNOSIS — Z79899 Other long term (current) drug therapy: Secondary | ICD-10-CM

## 2023-03-22 DIAGNOSIS — M25561 Pain in right knee: Secondary | ICD-10-CM | POA: Diagnosis not present

## 2023-03-22 DIAGNOSIS — A419 Sepsis, unspecified organism: Principal | ICD-10-CM | POA: Diagnosis present

## 2023-03-22 DIAGNOSIS — M5416 Radiculopathy, lumbar region: Secondary | ICD-10-CM | POA: Diagnosis present

## 2023-03-22 DIAGNOSIS — D61818 Other pancytopenia: Secondary | ICD-10-CM | POA: Diagnosis present

## 2023-03-22 DIAGNOSIS — I6930 Unspecified sequelae of cerebral infarction: Secondary | ICD-10-CM

## 2023-03-22 DIAGNOSIS — N179 Acute kidney failure, unspecified: Secondary | ICD-10-CM | POA: Diagnosis present

## 2023-03-22 DIAGNOSIS — D693 Immune thrombocytopenic purpura: Secondary | ICD-10-CM | POA: Diagnosis present

## 2023-03-22 DIAGNOSIS — Z87898 Personal history of other specified conditions: Secondary | ICD-10-CM

## 2023-03-22 DIAGNOSIS — R6520 Severe sepsis without septic shock: Secondary | ICD-10-CM | POA: Diagnosis present

## 2023-03-22 DIAGNOSIS — E7800 Pure hypercholesterolemia, unspecified: Secondary | ICD-10-CM | POA: Diagnosis present

## 2023-03-22 DIAGNOSIS — E872 Acidosis, unspecified: Secondary | ICD-10-CM | POA: Diagnosis present

## 2023-03-22 DIAGNOSIS — M1990 Unspecified osteoarthritis, unspecified site: Secondary | ICD-10-CM | POA: Diagnosis present

## 2023-03-22 DIAGNOSIS — N189 Chronic kidney disease, unspecified: Secondary | ICD-10-CM | POA: Insufficient documentation

## 2023-03-22 DIAGNOSIS — Z96651 Presence of right artificial knee joint: Secondary | ICD-10-CM | POA: Diagnosis present

## 2023-03-22 DIAGNOSIS — I129 Hypertensive chronic kidney disease with stage 1 through stage 4 chronic kidney disease, or unspecified chronic kidney disease: Secondary | ICD-10-CM | POA: Diagnosis present

## 2023-03-22 LAB — COMPREHENSIVE METABOLIC PANEL WITH GFR
ALT: 21 U/L (ref 0–44)
AST: 22 U/L (ref 15–41)
Albumin: 3.8 g/dL (ref 3.5–5.0)
Alkaline Phosphatase: 39 U/L (ref 38–126)
Anion gap: 17 — ABNORMAL HIGH (ref 5–15)
BUN: 23 mg/dL (ref 8–23)
CO2: 19 mmol/L — ABNORMAL LOW (ref 22–32)
Calcium: 8.9 mg/dL (ref 8.9–10.3)
Chloride: 100 mmol/L (ref 98–111)
Creatinine, Ser: 2.02 mg/dL — ABNORMAL HIGH (ref 0.61–1.24)
GFR, Estimated: 36 mL/min — ABNORMAL LOW
Glucose, Bld: 161 mg/dL — ABNORMAL HIGH (ref 70–99)
Potassium: 4 mmol/L (ref 3.5–5.1)
Sodium: 136 mmol/L (ref 135–145)
Total Bilirubin: 1.4 mg/dL — ABNORMAL HIGH (ref 0.3–1.2)
Total Protein: 6.9 g/dL (ref 6.5–8.1)

## 2023-03-22 LAB — CBC
HCT: 41.2 % (ref 39.0–52.0)
HCT: 38.7 % — ABNORMAL LOW (ref 39.0–52.0)
Hemoglobin: 13.7 g/dL (ref 13.0–17.0)
Hemoglobin: 13 g/dL (ref 13.0–17.0)
MCH: 29.7 pg (ref 26.0–34.0)
MCH: 30 pg (ref 26.0–34.0)
MCHC: 33.3 g/dL (ref 30.0–36.0)
MCHC: 33.6 g/dL (ref 30.0–36.0)
MCV: 89.4 fL (ref 80.0–100.0)
MCV: 89.2 fL (ref 80.0–100.0)
Platelets: 137 10*3/uL — ABNORMAL LOW (ref 150–400)
Platelets: 104 10*3/uL — ABNORMAL LOW (ref 150–400)
RBC: 4.61 MIL/uL (ref 4.22–5.81)
RBC: 4.34 MIL/uL (ref 4.22–5.81)
RDW: 13 % (ref 11.5–15.5)
RDW: 12.9 % (ref 11.5–15.5)
WBC: 12.7 10*3/uL — ABNORMAL HIGH (ref 4.0–10.5)
WBC: 12.9 10*3/uL — ABNORMAL HIGH (ref 4.0–10.5)
nRBC: 0 % (ref 0.0–0.2)
nRBC: 0 % (ref 0.0–0.2)

## 2023-03-22 LAB — URINALYSIS, ROUTINE W REFLEX MICROSCOPIC
Bilirubin Urine: NEGATIVE
Bilirubin Urine: NEGATIVE
Glucose, UA: NEGATIVE mg/dL
Glucose, UA: 50 mg/dL — AB
Ketones, ur: NEGATIVE mg/dL
Ketones, ur: NEGATIVE mg/dL
Nitrite: POSITIVE — AB
Nitrite: NEGATIVE
Protein, ur: NEGATIVE mg/dL
Protein, ur: 100 mg/dL — AB
Specific Gravity, Urine: 1.016 (ref 1.005–1.030)
Specific Gravity, Urine: 1.026 (ref 1.005–1.030)
WBC, UA: >50 WBC/hpf (ref 0–5)
WBC, UA: >50 WBC/hpf (ref 0–5)
pH: 5 (ref 5.0–8.0)
pH: 5 (ref 5.0–8.0)

## 2023-03-22 LAB — PROTIME-INR
INR: 1.2 (ref 0.8–1.2)
Prothrombin Time: 15.2 seconds (ref 11.4–15.2)

## 2023-03-22 LAB — COMPREHENSIVE METABOLIC PANEL
ALT: 25 U/L (ref 0–44)
AST: 26 U/L (ref 15–41)
Albumin: 4.2 g/dL (ref 3.5–5.0)
Alkaline Phosphatase: 39 U/L (ref 38–126)
Anion gap: 11 (ref 5–15)
BUN: 23 mg/dL (ref 8–23)
CO2: 20 mmol/L — ABNORMAL LOW (ref 22–32)
Calcium: 9.3 mg/dL (ref 8.9–10.3)
Chloride: 105 mmol/L (ref 98–111)
Creatinine, Ser: 1.8 mg/dL — ABNORMAL HIGH (ref 0.61–1.24)
GFR, Estimated: 42 mL/min — ABNORMAL LOW (ref >60)
Glucose, Bld: 132 mg/dL — ABNORMAL HIGH (ref 70–99)
Potassium: 4.1 mmol/L (ref 3.5–5.1)
Sodium: 136 mmol/L (ref 135–145)
Total Bilirubin: 0.9 mg/dL (ref 0.3–1.2)
Total Protein: 7.3 g/dL (ref 6.5–8.1)

## 2023-03-22 LAB — LACTIC ACID, PLASMA
Lactic Acid, Venous: 1.2 mmol/L (ref 0.5–1.9)
Lactic Acid, Venous: 1.2 mmol/L (ref 0.5–1.9)

## 2023-03-22 LAB — LIPASE, BLOOD
Lipase: 61 U/L — ABNORMAL HIGH (ref 11–51)
Lipase: 47 U/L (ref 11–51)

## 2023-03-22 MED ORDER — VANCOMYCIN HCL 1500 MG/300ML IV SOLN
1500.0000 mg | Freq: Once | INTRAVENOUS | Status: AC
  Administered 2023-03-22: 1500 mg via INTRAVENOUS
  Filled 2023-03-22: qty 300

## 2023-03-22 MED ORDER — ONDANSETRON 4 MG PO TBDP: 4.0000 mg | ORAL_TABLET | Freq: Three times a day (TID) | ORAL | 0 refills | Status: DC | PRN

## 2023-03-22 MED ORDER — LACTATED RINGERS IV SOLN: INTRAVENOUS | Status: DC

## 2023-03-22 MED ORDER — MORPHINE SULFATE (PF) 4 MG/ML IV SOLN
4.0000 mg | Freq: Once | INTRAVENOUS | Status: AC
  Administered 2023-03-22: 4 mg via INTRAVENOUS
  Filled 2023-03-22: qty 1

## 2023-03-22 MED ORDER — ONDANSETRON HCL 4 MG/2ML IJ SOLN
4.0000 mg | Freq: Once | INTRAMUSCULAR | Status: AC
  Administered 2023-03-22: 4 mg via INTRAVENOUS
  Filled 2023-03-22: qty 2

## 2023-03-22 MED ORDER — ACETAMINOPHEN 500 MG PO TABS
1000.0000 mg | ORAL_TABLET | Freq: Once | ORAL | Status: AC
  Administered 2023-03-22: 1000 mg via ORAL
  Filled 2023-03-22: qty 2

## 2023-03-22 MED ORDER — SODIUM CHLORIDE 0.9 % IV BOLUS
1000.0000 mL | Freq: Once | INTRAVENOUS | Status: AC
  Administered 2023-03-22: 1000 mL via INTRAVENOUS

## 2023-03-22 MED ORDER — SODIUM CHLORIDE 0.9 % IV SOLN
2.0000 g | Freq: Once | INTRAVENOUS | Status: AC
  Administered 2023-03-22: 2 g via INTRAVENOUS
  Filled 2023-03-22: qty 20

## 2023-03-22 MED ORDER — VANCOMYCIN VARIABLE DOSE PER UNSTABLE RENAL FUNCTION (PHARMACIST DOSING): Status: DC

## 2023-03-22 MED ORDER — OXYCODONE-ACETAMINOPHEN 5-325 MG PO TABS: 1.0000 | ORAL_TABLET | Freq: Four times a day (QID) | ORAL | 0 refills | Status: DC | PRN

## 2023-03-22 MED ORDER — CEPHALEXIN 500 MG PO CAPS: 500.0000 mg | ORAL_CAPSULE | Freq: Four times a day (QID) | ORAL | 0 refills | Status: DC

## 2023-03-22 MED ORDER — TAMSULOSIN HCL 0.4 MG PO CAPS: 0.4000 mg | ORAL_CAPSULE | Freq: Every day | ORAL | 0 refills | Status: AC

## 2023-03-22 MED ORDER — PHENAZOPYRIDINE HCL 200 MG PO TABS: 200.0000 mg | ORAL_TABLET | Freq: Three times a day (TID) | ORAL | 0 refills | Status: DC

## 2023-03-22 MED ORDER — LACTATED RINGERS IV BOLUS
30.0000 mL/kg | Freq: Once | INTRAVENOUS | Status: AC
  Administered 2023-03-22: 2340 mL via INTRAVENOUS

## 2023-03-22 MED ORDER — LACTATED RINGERS IV BOLUS
1000.0000 mL | Freq: Once | INTRAVENOUS | Status: AC
  Administered 2023-03-22: 1000 mL via INTRAVENOUS

## 2023-03-22 MED ORDER — PIPERACILLIN-TAZOBACTAM 3.375 G IVPB 30 MIN
3.3750 g | Freq: Once | INTRAVENOUS | Status: AC
  Administered 2023-03-22: 3.375 g via INTRAVENOUS
  Filled 2023-03-22: qty 50

## 2023-03-22 NOTE — ED Triage Notes (Signed)
Pt c/o burning and pressure when he voids x 2 days. States that he has hx of kidney stones, but denies back or flank pain.

## 2023-03-22 NOTE — ED Triage Notes (Signed)
The pt was here earlier today and diagnosed with a uti.  When he went home he was only able to dribble a little urine  the last time he urinated was around 1600 after drinking lots of fluids  he has had tylenol and advil  he reports a temp of 105 earlier?  He has been having chills also

## 2023-03-22 NOTE — ED Notes (Signed)
Patient transported to CT 

## 2023-03-22 NOTE — Progress Notes (Signed)
Pharmacy Antibiotic Note  Gordon Christian is a 62 y.o. male for which pharmacy has been consulted for vancomycin dosing for bacteremia.  Patient with a history of recurrent kidney stones, DM, CKD. Patient presenting with fever and chills. Patient seen this morning and diagnosed with UTI.  SCr 2.02 - up 0.2 from this morning WBC 12.7>12.9; LA 1.2; T 100.6>99.5>98.6; HR 99>>>95; RR 16>>>11  Plan: Zosyn per MD Vancomycin 1500 mg once, subsequent dosing as indicated per random vancomycin level until renal function stable and/or improved, at which time scheduled dosing can be considered Monitor WBC, fever, renal function, cultures De-escalate when able  Height: 5\' 6"  (167.6 cm) Weight: 78 kg (171 lb 15.3 oz) IBW/kg (Calculated) : 63.8  Temp (24hrs), Avg:99.5 F (37.5 C), Min:98.6 F (37 C), Max:100.6 F (38.1 C)  Recent Labs  Lab 03/22/23 0626 03/22/23 0657 03/22/23 1841 03/22/23 2037  WBC 12.7*  --  12.9*  --   CREATININE 1.80*  --  2.02*  --   LATICACIDVEN  --  1.2  --  1.2    Estimated Creatinine Clearance: 36.3 mL/min (A) (by C-G formula based on SCr of 2.02 mg/dL (H)).    No Known Allergies  Microbiology results: Pending  Thank you for allowing pharmacy to be a part of this patient's care.  Delmar Landau, PharmD, BCPS 03/22/2023 9:27 PM ED Clinical Pharmacist -  [phone]

## 2023-03-22 NOTE — ED Provider Notes (Signed)
[place] [facility]ENC[facility] DEPARTMENT AT [facility] Provider Note   CSN: [id number] Arrival date & time: 03/22/23  2956    History  Chief Complaint  Patient presents with   Dysuria    Gordon Christian is a 62 y.o. male history of recurrent kidney stones, DM, CKD for evaluation of dysuria.  Began 2 days ago.  History of kidney stones.  Has pain to his right inguinal area.  Feels like he cannot fully empty his bladder.  No hematuria however has noted some darker urine which she relates to vitamin B supplementation.  History of septic kidney stones requiring surgical intervention and hospitalization for 9 days.  Has not recently been seen by Urology, his last stone was approximately 9 months ago.  Noted to be febrile this morning, no meds PTA.  No chest pain, shortness of breath, cough, flank pain, back pain, N/V, diarrhea, rashes or lesions.  Does not feel like his typical kidney stone.  Last p.o. intake 5 AM this morning, clear liquids  HPI     Home Medications Prior to Admission medications   Medication Sig Start Date End Date Taking? Authorizing Provider  cephALEXin (KEFLEX) 500 MG capsule Take 1 capsule (500 mg total) by mouth 4 (four) times daily. 03/22/23  Yes Creta Dorame A, PA-C  ondansetron (ZOFRAN-ODT) 4 MG disintegrating tablet Take 1 tablet (4 mg total) by mouth every 8 (eight) hours as needed for nausea or vomiting. 03/22/23  Yes Aneira Cavitt A, PA-C  oxyCODONE-acetaminophen (PERCOCET/ROXICET) 5-325 MG tablet Take 1 tablet by mouth every 6 (six) hours as needed for severe pain. 03/22/23  Yes Farhaan Mabee A, PA-C  phenazopyridine (PYRIDIUM) 200 MG tablet Take 1 tablet (200 mg total) by mouth 3 (three) times daily. 03/22/23  Yes Jaelynne Hockley A, PA-C  tamsulosin (FLOMAX) 0.4 MG CAPS capsule Take 1 capsule (0.4 mg total) by mouth daily for 5 days. 03/22/23 03/27/23 Yes Kamani Magnussen A, PA-C  acetaminophen (TYLENOL) 650 MG CR tablet Take 1,300 mg by mouth daily.     [provider]  aspirin 325 MG tablet Take 325 mg by mouth 2 (two) times daily.    [provider]  Carboxymethylcellul-Glycerin (LUBRICATING EYE DROPS OP) Place 1 drop into both eyes daily as needed (dry eyes).    [provider]  docusate sodium (COLACE) 100 MG capsule Take 1 capsule (100 mg total) by mouth 2 (two) times daily as needed for mild constipation. 09/04/22   Jene Every, MD  gabapentin (NEURONTIN) 300 MG capsule Take 300 mg by mouth 2 (two) times daily.    [provider]  levETIRAcetam (KEPPRA) 1000 MG tablet Take 1,000 mg by mouth at bedtime.    [provider]  lisinopril-hydrochlorothiazide (ZESTORETIC) 20-12.5 MG tablet Take 1 tablet by mouth daily.    [provider]  metFORMIN (GLUCOPHAGE) 500 MG tablet Take 500 mg by mouth 2 (two) times daily.    [provider]  polyethylene glycol (MIRALAX / GLYCOLAX) 17 g packet Take 17 g by mouth daily. 09/04/22   Jene Every, MD  rosuvastatin (CRESTOR) 20 MG tablet Take 20 mg by mouth daily.    [provider]  temazepam (RESTORIL) 30 MG capsule Take 30 mg by mouth at bedtime.    [provider]  tiZANidine (ZANAFLEX) 4 MG tablet Take 4 mg by mouth at bedtime.    [provider]  Vitamin D, Ergocalciferol, (DRISDOL) 1.25 MG (50000 UNIT) CAPS capsule Take 50,000 Units by mouth every Wednesday.  [provider]      Allergies    Patient has no known allergies.    Review of Systems   Review of Systems  Constitutional:  Positive for fatigue and fever.  HENT: Negative.    Respiratory: Negative.    Cardiovascular: Negative.   Gastrointestinal: Negative.   Genitourinary:  Positive for decreased urine volume, difficulty urinating and dysuria. Negative for flank pain, frequency, genital sores, hematuria, penile discharge, penile pain, penile swelling, scrotal swelling, testicular pain and urgency.  Musculoskeletal: Negative.   Skin:  Negative.   Neurological: Negative.   All other systems reviewed and are negative.   Physical Exam Updated Vital Signs BP (!) 100/58   Pulse 89   Temp 99.5 F (37.5 C) (Oral)   Resp 16   Ht 5' 6.5" (1.689 m)   Wt 78 kg   SpO2 93%   BMI 27.34 kg/m  Physical Exam Vitals and nursing note reviewed.  Constitutional:      General: He is not in acute distress.    Appearance: He is well-developed. He is not ill-appearing, toxic-appearing or diaphoretic.  HENT:     Head: Normocephalic and atraumatic.  Eyes:     Pupils: Pupils are equal, round, and reactive to light.  Cardiovascular:     Rate and Rhythm: Normal rate and regular rhythm.     Pulses: Normal pulses.     Heart sounds: Normal heart sounds.  Pulmonary:     Effort: Pulmonary effort is normal. No respiratory distress.     Breath sounds: Normal breath sounds.  Abdominal:     General: Bowel sounds are normal. There is no distension.     Palpations: Abdomen is soft.     Tenderness: There is abdominal tenderness in the right lower quadrant and suprapubic area. There is no right CVA tenderness, left CVA tenderness, guarding or rebound.     Hernia: No hernia is present.    Musculoskeletal:        General: Normal range of motion.     Cervical back: Normal range of motion and neck supple.     Comments: No bony tenderness, compartments soft, moves all 4 extremities  Skin:    General: Skin is warm and dry.     Capillary Refill: Capillary refill takes less than 2 seconds.     Comments: No rashes or lesions  Neurological:     General: No focal deficit present.     Mental Status: He is alert and oriented to person, place, and time.     Comments: Ambulatory     ED Results / Procedures / Treatments   Labs (all labs ordered are listed, but only abnormal results are displayed) Labs Reviewed  COMPREHENSIVE METABOLIC PANEL - Abnormal; Notable for the following components:      Result Value   CO2 20 (*)    Glucose, Bld 132 (*)     Creatinine, Ser 1.80 (*)    GFR, Estimated 42 (*)    All other components within normal limits  CBC - Abnormal; Notable for the following components:   WBC 12.7 (*)    Platelets 137 (*)    All other components within normal limits  URINALYSIS, ROUTINE W REFLEX MICROSCOPIC - Abnormal; Notable for the following components:   APPearance HAZY (*)    Hgb urine dipstick SMALL (*)    Nitrite POSITIVE (*)    Leukocytes,Ua MODERATE (*)    Bacteria, UA RARE (*)    All other components within normal  limits  LIPASE, BLOOD - Abnormal; Notable for the following components:   Lipase 61 (*)    All other components within normal limits  CULTURE, BLOOD (ROUTINE X 2)  CULTURE, BLOOD (ROUTINE X 2)  URINE CULTURE  LACTIC ACID, PLASMA    EKG None  Radiology CT Renal Stone Study  Result Date: 03/22/2023 CLINICAL DATA:  Abdominal and flank pain with stone suspected. Right lower quadrant pain EXAM: CT ABDOMEN AND PELVIS WITHOUT CONTRAST TECHNIQUE: Multidetector CT imaging of the abdomen and pelvis was performed following the standard protocol without IV contrast. RADIATION DOSE REDUCTION: This exam was performed according to the departmental dose-optimization program which includes automated exposure control, adjustment of the mA and/or kV according to patient size and/or use of iterative reconstruction technique. COMPARISON:  02/02/2005 FINDINGS: Lower chest:  Coronary atherosclerosis.  No acute finding. Hepatobiliary: No focal liver abnormality.Cholecystectomy. No biliary dilatation. Pancreas: Unremarkable. Spleen: Generous size with 15 cm craniocaudal span. Adrenals/Urinary Tract: Negative adrenals. Perinephric stranding bilaterally, nonspecific in symmetric-likely baseline. Punctate bilateral calculi. No hydronephrosis or ureteral stone. Unremarkable bladder. Stomach/Bowel: No obstruction. No appendicitis. Numerous distal colonic diverticula. Vascular/Lymphatic: No acute vascular abnormality. Scattered  atheromatous calcification. No mass or adenopathy. Reproductive:Symmetric enlargement of the prostate gland. Other: No ascites or pneumoperitoneum. Musculoskeletal: No acute abnormalities. IMPRESSION: 1. No acute finding.  No hydronephrosis or ureteral calculus. 2. Small bilateral renal calculi. Atherosclerosis and colonic diverticulosis. Electronically Signed   By: Tiburcio Pea M.D.   On: 03/22/2023 08:20    Procedures Procedures    Medications Ordered in ED Medications  sodium chloride 0.9 % bolus 1,000 mL (0 mLs Intravenous Stopped 03/22/23 0905)  ondansetron (ZOFRAN) injection 4 mg (4 mg Intravenous Given 03/22/23 0740)  morphine (PF) 4 MG/ML injection 4 mg (4 mg Intravenous Given 03/22/23 0740)  cefTRIAXone (ROCEPHIN) 2 g in sodium chloride 0.9 % 100 mL IVPB (0 g Intravenous Stopped 03/22/23 0827)  acetaminophen (TYLENOL) tablet 1,000 mg (1,000 mg Oral Given 03/22/23 0825)   ED Course/ Medical Decision Making/ A&P   62 year old here for evaluation of dysuria over the last few days.  On arrival found to be febrile, tachycardic, no meds PTA.  Does have history of recurrent kidney stones including a septic kidney stone requiring surgical intervention and extended inpatient stay.  Pain to suprapubic region right lower quadrant.  Some darker urine however denies any gross hematuria.  He is on aspirin.  His heart and lungs are clear.  His abdomen is soft, some mild tenderness to suprapubic region and right lower quadrant.  Negative CVA tap bilaterally.  He does appear otherwise well.  Will plan on labs, imaging and reassess.  Workup started from triage which I personally viewed and interpreted all labs and imaging.  CBC leukocytosis 12.7 Metabolic panel creatinine 1.8, similar to baseline Lipase 61 Lactic 1.2 UA positive for UTI  Patient given IV fluids, pain management, Rocephin for UTI.  No prior micro to assess for sensitivities.  Pending CT stone study  CT stone study negative for  hydronephrosis, kidney stone  Patient reassessed.  Pain improved.  Tolerated p.o. intake.  Discussed labs and imaging with patient, significant other in room.  He does have some difficulty starting his stream, bladder scan shows 80 postvoid residual.  Will start on Flomax for symptomatic management, Pyridium, antibiotics.  Will have follow-up outpatient, return for any worsening symptoms.  Patient is nontoxic, nonseptic appearing, in no apparent distress.  Patient's pain and other symptoms adequately managed in emergency department.  Fluid  bolus given.  Labs, imaging and vitals reviewed.  Patient does not meet the SIRS or Sepsis criteria.  On repeat exam patient does not have a surgical abdomin and there are no peritoneal signs.  No indication of appendicitis, bowel obstruction, bowel perforation, cholecystitis, diverticulitis, infected kidney stone.    The patient has been appropriately medically screened and/or stabilized in the ED. I have low suspicion for any other emergent medical condition which would require further screening, evaluation or treatment in the ED or require inpatient management.  I have discussed the diagnosis with the patient and answered all questions.  Pain is been managed while in the emergency department and patient has no further complaints prior to discharge.  Patient is comfortable with plan discussed in room and is stable for discharge at this time.  I have discussed strict return precautions for returning to the emergency department.  Patient was encouraged to follow-up with PCP/specialist refer to at discharge.                            Medical Decision Making Amount and/or Complexity of Data Reviewed External Data Reviewed: labs, radiology and notes. Labs: ordered. Decision-making details documented in ED Course. Radiology: ordered and independent interpretation performed. Decision-making details documented in ED Course.  Risk OTC drugs. Prescription drug  management. Parenteral controlled substances. Decision regarding hospitalization. Diagnosis or treatment significantly limited by social determinants of health.           Final Clinical Impression(s) / ED Diagnoses Final diagnoses:  Lower urinary tract infectious disease    Rx / DC Orders ED Discharge Orders          Ordered    cephALEXin (KEFLEX) 500 MG capsule  4 times daily        03/22/23 0917    ondansetron (ZOFRAN-ODT) 4 MG disintegrating tablet  Every 8 hours PRN        03/22/23 0917    phenazopyridine (PYRIDIUM) 200 MG tablet  3 times daily        03/22/23 0917    tamsulosin (FLOMAX) 0.4 MG CAPS capsule  Daily        03/22/23 0917    oxyCODONE-acetaminophen (PERCOCET/ROXICET) 5-325 MG tablet  Every 6 hours PRN        03/22/23 0917              Kelsee Preslar A, PA-C 03/22/23 0921    Gloris Manchester, MD 03/23/23 1239

## 2023-03-22 NOTE — Discharge Instructions (Addendum)
It was a pleasure taking care of you here in the Emergency Department  As we discussed in the room your urinalysis did show positive for urinary tract infection.  You are given IV antibiotics here in the emergency department  Your blood work showed a mildly elevated white blood cell count which is to be expected with the infection.  Your CT scan did not show any evidence of active kidney stones which would be causing pain which is reassuring  We have started you on a few medications to help with your symptoms  Keflex-this is an antibiotic, take until complete Zofran-this is a nausea medicine Pyridium-this is a medication to help with the symptoms of urinary tract infection Flomax-this is a medication that dilate your ureters which will help with the flow of your urine stream.  Please use caution when you go from sitting to standing as this medication also dilates your blood vessels and can make you dizzy. Percocet-this is a very small prescription I would recommend only taking for breakthrough pain.  This medication also contains Tylenol do not take any additional Tylenol or acetaminophen containing medications when you take this  Please have your primary care provider early next week for reevaluation  Return for new or worsening symptoms such as inability to urinate, persistent fever not coming down with antipyretics such as Motrin or Tylenol, pain not improved with medications prescribed here.

## 2023-03-22 NOTE — ED Provider Notes (Signed)
[place] EMERGENCY DEPARTMENT AT [facility] Provider Note  CSN: [id number] Arrival date & time: 03/22/23 1813  Chief Complaint(s) Abdominal Pain  HPI Gordon Christian is a 62 y.o. male history of prior stroke, diabetes, hyperlipidemia presenting to the emergency department with fever.  Patient reports he was seen earlier with UTI.  He was given antibiotics in the emergency department.  He also was prescribed antibiotics, went home and picked up a dose.  At home he reports diminished urine output as well as fever up to 104.5, chills with shaking, generalized weakness and lightheadedness.  He felt like he should get reevaluated so came back to the emergency department.  No nausea or vomiting, chest pain, cough, headaches, neck pain or stiffness, abdominal pain.   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 Authorizing Provider  acetaminophen (TYLENOL) 650 MG CR tablet Take 1,300 mg by mouth every 8 (eight) hours as needed for pain.   Yes [provider]  aspirin 325 MG tablet Take 325 mg by mouth 2 (two) times daily.   Yes [provider]  Carboxymethylcellul-Glycerin (LUBRICATING EYE DROPS OP) Place 1 drop into both eyes daily as needed (dry eyes).   Yes [provider]  cephALEXin (KEFLEX) 500 MG capsule Take 1 capsule (500 mg total) by mouth 4 (four) times daily. Patient taking differently: Take 500 mg by mouth daily as needed (For infections). 03/22/23  Yes Henderly, Britni A, PA-C  docusate sodium (COLACE)  100 MG capsule Take 1 capsule (100 mg total) by mouth 2 (two) times daily as needed for mild constipation. 09/04/22  Yes Jene Every, MD  gabapentin (NEURONTIN) 300 MG capsule Take 300 mg by mouth 2 (two) times daily.   Yes [provider]  levETIRAcetam (KEPPRA) 1000 MG tablet Take 1,000 mg by mouth at bedtime.   Yes [provider]  metFORMIN (GLUCOPHAGE) 500 MG tablet Take 500 mg by mouth 2 (two) times daily.   Yes [provider]  ondansetron (ZOFRAN-ODT) 4 MG disintegrating tablet Take 1 tablet (4 mg total) by mouth every 8 (eight) hours as needed for nausea or vomiting. 03/22/23  Yes Henderly, Britni A, PA-C  oxyCODONE-acetaminophen (PERCOCET/ROXICET) 5-325 MG tablet Take 1 tablet by mouth every 6 (six) hours as needed for severe pain. 03/22/23  Yes Henderly, Britni A, PA-C  rosuvastatin (CRESTOR) 20 MG tablet Take 20 mg by mouth every evening.   Yes [provider]  Semaglutide,0.25 or 0.5MG /DOS, 2 MG/3ML SOPN Inject 0.5 mg into the skin once a week. 02/06/23  Yes [provider]  tamsulosin (FLOMAX) 0.4 MG CAPS capsule Take 1 capsule (0.4 mg total) by mouth daily for 5 days. 03/22/23 03/27/23 Yes Henderly, Britni A, PA-C  temazepam (RESTORIL) 15 MG capsule Take 15 mg by mouth at bedtime as needed for sleep.   Yes [provider]  tiZANidine (ZANAFLEX) 4 MG tablet Take 4 mg by mouth at bedtime.   Yes [provider]  Vitamin D, Ergocalciferol, (DRISDOL) 1.25  MG (50000 UNIT) CAPS capsule Take 50,000 Units by mouth every Wednesday.   Yes [provider]  vitamin E 1000 UNIT capsule Take 1,000 Units by mouth daily.   Yes [provider]  phenazopyridine (PYRIDIUM) 200 MG tablet Take 1 tablet (200 mg total) by mouth 3 (three) times daily. Patient not taking: Reported on 03/22/2023 03/22/23   Henderly, Britni A, PA-C  polyethylene glycol (MIRALAX / GLYCOLAX) 17 g packet Take 17 g by mouth daily. Patient not taking: Reported  on 03/22/2023 09/04/22   Jene Every, MD                                                                                                                                    Past Surgical History Past Surgical History:  Procedure Laterality Date   APPENDECTOMY     CHOLECYSTECTOMY     CYSTOSCOPY W/ URETERAL STENT PLACEMENT     KNEE ARTHROSCOPY Right    SHOULDER ARTHROSCOPY Right 1999   TOTAL KNEE ARTHROPLASTY Right 09/04/2022   Procedure: TOTAL KNEE ARTHROPLASTY;  Surgeon: Jene Every, MD;  Location: [facility];  Service: Orthopedics;  Laterality: Right;   Family History No family history on file.  Social History Social History   Tobacco Use   Smoking status: Never   Smokeless tobacco: Never  Vaping Use   Vaping Use: Never used  Substance Use Topics   Alcohol use: Yes    Comment: occ   Drug use: Never   Allergies Patient has no known allergies.  Review of Systems Review of Systems  All other systems reviewed and are negative.   Physical Exam Vital Signs  I have reviewed the triage vital signs BP (!) 86/61   Pulse 87   Temp 98.6 F (37 C) (Oral)   Resp 15   Ht 5\' 6"  (1.676 m)   Wt 78 kg   SpO2 95%   BMI 27.75 kg/m  Physical Exam Vitals and nursing note reviewed.  Constitutional:      General: He is not in acute distress.    Appearance: Normal appearance.  HENT:     Mouth/Throat:     Mouth: Mucous membranes are moist.  Eyes:     Conjunctiva/sclera: Conjunctivae normal.  Cardiovascular:     Rate and Rhythm: Normal rate and regular rhythm.  Pulmonary:     Effort: Pulmonary effort is normal. No respiratory distress.     Breath sounds: Normal breath sounds.  Abdominal:     General: Abdomen is flat.     Palpations: Abdomen is soft.     Tenderness: There is no abdominal tenderness.  Musculoskeletal:     Right lower leg: No edema.     Left lower leg: No edema.  Skin:    General: Skin is warm and dry.     Capillary Refill: Capillary refill takes less than 2  seconds.  Neurological:     Mental Status: He  is alert and oriented to person, place, and time. Mental status is at baseline.  Psychiatric:        Mood and Affect: Mood normal.        Behavior: Behavior normal.     ED Results and Treatments Labs (all labs ordered are listed, but only abnormal results are displayed) Labs Reviewed  COMPREHENSIVE METABOLIC PANEL - Abnormal; Notable for the following components:      Result Value   CO2 19 (*)    Glucose, Bld 161 (*)    Creatinine, Ser 2.02 (*)    Total Bilirubin 1.4 (*)    GFR, Estimated 36 (*)    Anion gap 17 (*)    All other components within normal limits  CBC - Abnormal; Notable for the following components:   WBC 12.9 (*)    HCT 38.7 (*)    Platelets 104 (*)    All other components within normal limits  URINALYSIS, ROUTINE W REFLEX MICROSCOPIC - Abnormal; Notable for the following components:   Color, Urine AMBER (*)    APPearance CLOUDY (*)    Glucose, UA 50 (*)    Hgb urine dipstick SMALL (*)    Protein, ur 100 (*)    Leukocytes,Ua LARGE (*)    Bacteria, UA FEW (*)    All other components within normal limits  CULTURE, BLOOD (ROUTINE X 2)  CULTURE, BLOOD (ROUTINE X 2)  LIPASE, BLOOD  LACTIC ACID, PLASMA  PROTIME-INR  LACTIC ACID, PLASMA                                                                                                                          Radiology DG Chest 2 View  Result Date: 03/22/2023 CLINICAL DATA:  Suspected Sepsis EXAM: CHEST - 2 VIEW COMPARISON:  None Available. FINDINGS: The heart and mediastinal contours are within normal limits. Low lung volumes. No focal consolidation. No pulmonary edema. No pleural effusion. No pneumothorax. No acute osseous abnormality. IMPRESSION: No active cardiopulmonary disease. Electronically Signed   By: Tish Frederickson M.D.   On: 03/22/2023 22:08   CT Renal Stone Study  Result Date: 03/22/2023 CLINICAL DATA:  Abdominal and flank pain with stone suspected.  Right lower quadrant pain EXAM: CT ABDOMEN AND PELVIS WITHOUT CONTRAST TECHNIQUE: Multidetector CT imaging of the abdomen and pelvis was performed following the standard protocol without IV contrast. RADIATION DOSE REDUCTION: This exam was performed according to the departmental dose-optimization program which includes automated exposure control, adjustment of the mA and/or kV according to patient size and/or use of iterative reconstruction technique. COMPARISON:  02/02/2005 FINDINGS: Lower chest:  Coronary atherosclerosis.  No acute finding. Hepatobiliary: No focal liver abnormality.Cholecystectomy. No biliary dilatation. Pancreas: Unremarkable. Spleen: Generous size with 15 cm craniocaudal span. Adrenals/Urinary Tract: Negative adrenals. Perinephric stranding bilaterally, nonspecific in symmetric-likely baseline. Punctate bilateral calculi. No hydronephrosis or ureteral stone. Unremarkable bladder. Stomach/Bowel: No obstruction. No appendicitis. Numerous distal colonic diverticula. Vascular/Lymphatic: No acute vascular abnormality. Scattered atheromatous calcification.  No mass or adenopathy. Reproductive:Symmetric enlargement of the prostate gland. Other: No ascites or pneumoperitoneum. Musculoskeletal: No acute abnormalities. IMPRESSION: 1. No acute finding.  No hydronephrosis or ureteral calculus. 2. Small bilateral renal calculi. Atherosclerosis and colonic diverticulosis. Electronically Signed   By: Tiburcio Pea M.D.   On: 03/22/2023 08:20    Pertinent labs & imaging results that were available during my care of the patient were reviewed by me and considered in my medical decision making (see MDM for details).  Medications Ordered in ED Medications  vancomycin (VANCOREADY) IVPB 1500 mg/300 mL (1,500 mg Intravenous New Bag/Given 03/22/23 2235)  vancomycin variable dose per unstable renal function (pharmacist dosing) (has no administration in time range)  lactated ringers bolus 2,340 mL (2,340 mLs  Intravenous New Bag/Given 03/22/23 2153)  piperacillin-tazobactam (ZOSYN) IVPB 3.375 g (0 g Intravenous Stopped 03/22/23 2232)                                                                                                                                     Procedures .Critical Care  Performed by: Lonell Grandchild, MD Authorized by: Lonell Grandchild, MD   Critical care provider statement:    Critical care time (minutes):  38   Critical care was necessary to treat or prevent imminent or life-threatening deterioration of the following conditions:  Sepsis   Critical care was time spent personally by me on the following activities:  Development of treatment plan with patient or surrogate, discussions with consultants, evaluation of patient's response to treatment, examination of patient, ordering and review of laboratory studies, ordering and review of radiographic studies, ordering and performing treatments and interventions, pulse oximetry, re-evaluation of patient's condition and review of old charts   Care discussed with: admitting provider     (including critical care time)  Medical Decision Making / ED Course   MDM:  62 year old male presenting to the emergency department with urinary symptoms, weakness.  Patient seen earlier in the emergency department.  Had CT scan at that time, reviewed results, no evidence of acute abdominal pathology.  Urinalysis was concerning for infection and he was started on ceftriaxone.  Suspect possible urosepsis given now patient hypotensive, initially with some tachycardia.  Blood cultures were obtained earlier, obtain repeat set of blood cultures.  Given patient was treated with ceftriaxone and then developed the symptoms later in the day, will broaden antibiotic selection with Zosyn and vancomycin to cover for other causes such as Enterococcus or pseudomonal UTI.  Doubt other causes of infection given clear source, lack of abdominal pain or  tenderness, clear lungs, no headaches.  Will give large fluid bolus.  Lactate is reassuring.  Will need admission.  Clinical Course as of 03/22/23 2307  Sat Mar 22, 2023  2305 Discussed with Dr. Janalyn Shy who will admit patient for urosepsis. Patient has had stable MAP.  [WS]    Clinical Course User Index [WS] Alvino Blood  L, MD     Additional history obtained:  -External records from outside source obtained and reviewed including: Chart review including previous notes, labs, imaging, consultation notes including ED note earlier today   Lab Tests: -I ordered, reviewed, and interpreted labs.   The pertinent results include:   Labs Reviewed  COMPREHENSIVE METABOLIC PANEL - Abnormal; Notable for the following components:      Result Value   CO2 19 (*)    Glucose, Bld 161 (*)    Creatinine, Ser 2.02 (*)    Total Bilirubin 1.4 (*)    GFR, Estimated 36 (*)    Anion gap 17 (*)    All other components within normal limits  CBC - Abnormal; Notable for the following components:   WBC 12.9 (*)    HCT 38.7 (*)    Platelets 104 (*)    All other components within normal limits  URINALYSIS, ROUTINE W REFLEX MICROSCOPIC - Abnormal; Notable for the following components:   Color, Urine AMBER (*)    APPearance CLOUDY (*)    Glucose, UA 50 (*)    Hgb urine dipstick SMALL (*)    Protein, ur 100 (*)    Leukocytes,Ua LARGE (*)    Bacteria, UA FEW (*)    All other components within normal limits  CULTURE, BLOOD (ROUTINE X 2)  CULTURE, BLOOD (ROUTINE X 2)  LIPASE, BLOOD  LACTIC ACID, PLASMA  PROTIME-INR  LACTIC ACID, PLASMA    Notable for UTI, leukocytosis  EKG   EKG Interpretation  Date/Time:  Saturday March 22 2023 21:19:55 EDT Ventricular Rate:  93 PR Interval:  169 QRS Duration: 150 QT Interval:  406 QTC Calculation: 505 R Axis:   36 Text Interpretation: Sinus rhythm Left bundle branch block Confirmed by Alvino Blood ([id number]) on 03/22/2023 9:25:49 PM          Imaging Studies ordered: Reviewed CT scan from earlier today, shows no acute process   Medicines ordered and prescription drug management: Meds ordered this encounter  Medications   lactated ringers bolus 2,340 mL   piperacillin-tazobactam (ZOSYN) IVPB 3.375 g    Order Specific Question:   Antibiotic Indication:    Answer:   Sepsis   vancomycin (VANCOREADY) IVPB 1500 mg/300 mL    Order Specific Question:   Indication:    Answer:   Bacteremia   vancomycin variable dose per unstable renal function (pharmacist dosing)    -I have reviewed the patients home medicines and have made adjustments as needed   Consultations Obtained: I requested consultation with the hospitalist,  and discussed lab and imaging findings as well as pertinent plan - they recommend: admission   Cardiac Monitoring: The patient was maintained on a cardiac monitor.  I personally viewed and interpreted the cardiac monitored which showed an underlying rhythm of: nsr    Reevaluation: After the interventions noted above, I reevaluated the patient and found that their symptoms have improved  Co morbidities that complicate the patient evaluation  Past Medical History:  Diagnosis Date   Arthritis    Diabetes mellitus without complication (HCC)    History of kidney stones    Hypercholesteremia    Stroke [facility])       Dispostion: Disposition decision including need for hospitalization was considered, and patient admitted to the hospital.    Final Clinical Impression(s) / ED Diagnoses Final diagnoses:  Sepsis due to urinary tract infection (HCC)     This chart was dictated using voice recognition software.  Despite best  efforts to proofread,  errors can occur which can change the documentation meaning.    Lonell Grandchild, MD 03/22/23 2308

## 2023-03-23 ENCOUNTER — Encounter (HOSPITAL_COMMUNITY): Payer: Self-pay | Admitting: Internal Medicine

## 2023-03-23 DIAGNOSIS — R6520 Severe sepsis without septic shock: Secondary | ICD-10-CM

## 2023-03-23 DIAGNOSIS — R569 Unspecified convulsions: Secondary | ICD-10-CM | POA: Diagnosis present

## 2023-03-23 DIAGNOSIS — I6930 Unspecified sequelae of cerebral infarction: Secondary | ICD-10-CM

## 2023-03-23 DIAGNOSIS — G8929 Other chronic pain: Secondary | ICD-10-CM | POA: Insufficient documentation

## 2023-03-23 DIAGNOSIS — I10 Essential (primary) hypertension: Secondary | ICD-10-CM | POA: Insufficient documentation

## 2023-03-23 DIAGNOSIS — Z96651 Presence of right artificial knee joint: Secondary | ICD-10-CM | POA: Diagnosis present

## 2023-03-23 DIAGNOSIS — N179 Acute kidney failure, unspecified: Secondary | ICD-10-CM

## 2023-03-23 DIAGNOSIS — D693 Immune thrombocytopenic purpura: Secondary | ICD-10-CM

## 2023-03-23 DIAGNOSIS — N390 Urinary tract infection, site not specified: Secondary | ICD-10-CM | POA: Diagnosis present

## 2023-03-23 DIAGNOSIS — Z79899 Other long term (current) drug therapy: Secondary | ICD-10-CM | POA: Diagnosis not present

## 2023-03-23 DIAGNOSIS — Z87898 Personal history of other specified conditions: Secondary | ICD-10-CM

## 2023-03-23 DIAGNOSIS — Z7984 Long term (current) use of oral hypoglycemic drugs: Secondary | ICD-10-CM | POA: Diagnosis not present

## 2023-03-23 DIAGNOSIS — N200 Calculus of kidney: Secondary | ICD-10-CM | POA: Diagnosis present

## 2023-03-23 DIAGNOSIS — G894 Chronic pain syndrome: Secondary | ICD-10-CM | POA: Diagnosis present

## 2023-03-23 DIAGNOSIS — E119 Type 2 diabetes mellitus without complications: Secondary | ICD-10-CM

## 2023-03-23 DIAGNOSIS — E1122 Type 2 diabetes mellitus with diabetic chronic kidney disease: Secondary | ICD-10-CM | POA: Diagnosis present

## 2023-03-23 DIAGNOSIS — E785 Hyperlipidemia, unspecified: Secondary | ICD-10-CM

## 2023-03-23 DIAGNOSIS — E872 Acidosis, unspecified: Secondary | ICD-10-CM | POA: Diagnosis present

## 2023-03-23 DIAGNOSIS — D61818 Other pancytopenia: Secondary | ICD-10-CM | POA: Diagnosis present

## 2023-03-23 DIAGNOSIS — Z87442 Personal history of urinary calculi: Secondary | ICD-10-CM | POA: Diagnosis not present

## 2023-03-23 DIAGNOSIS — E7800 Pure hypercholesterolemia, unspecified: Secondary | ICD-10-CM | POA: Diagnosis present

## 2023-03-23 DIAGNOSIS — K5730 Diverticulosis of large intestine without perforation or abscess without bleeding: Secondary | ICD-10-CM | POA: Diagnosis present

## 2023-03-23 DIAGNOSIS — N1832 Chronic kidney disease, stage 3b: Secondary | ICD-10-CM | POA: Diagnosis present

## 2023-03-23 DIAGNOSIS — I129 Hypertensive chronic kidney disease with stage 1 through stage 4 chronic kidney disease, or unspecified chronic kidney disease: Secondary | ICD-10-CM | POA: Diagnosis present

## 2023-03-23 DIAGNOSIS — A419 Sepsis, unspecified organism: Secondary | ICD-10-CM

## 2023-03-23 DIAGNOSIS — I69354 Hemiplegia and hemiparesis following cerebral infarction affecting left non-dominant side: Secondary | ICD-10-CM | POA: Diagnosis not present

## 2023-03-23 DIAGNOSIS — E8729 Other acidosis: Secondary | ICD-10-CM

## 2023-03-23 DIAGNOSIS — Z8249 Family history of ischemic heart disease and other diseases of the circulatory system: Secondary | ICD-10-CM | POA: Diagnosis not present

## 2023-03-23 DIAGNOSIS — R6521 Severe sepsis with septic shock: Secondary | ICD-10-CM | POA: Diagnosis present

## 2023-03-23 DIAGNOSIS — Z7985 Long-term (current) use of injectable non-insulin antidiabetic drugs: Secondary | ICD-10-CM | POA: Diagnosis not present

## 2023-03-23 DIAGNOSIS — G4700 Insomnia, unspecified: Secondary | ICD-10-CM | POA: Diagnosis present

## 2023-03-23 HISTORY — DX: Essential (primary) hypertension: I10

## 2023-03-23 LAB — CBG MONITORING, ED
Glucose-Capillary: 110 mg/dL — ABNORMAL HIGH (ref 70–99)
Glucose-Capillary: 130 mg/dL — ABNORMAL HIGH (ref 70–99)

## 2023-03-23 LAB — MAGNESIUM
Magnesium: 1.1 mg/dL — ABNORMAL LOW (ref 1.7–2.4)
Magnesium: 1.5 mg/dL — ABNORMAL LOW (ref 1.7–2.4)

## 2023-03-23 LAB — COMPREHENSIVE METABOLIC PANEL
ALT: 17 U/L (ref 0–44)
AST: 17 U/L (ref 15–41)
Albumin: 2.7 g/dL — ABNORMAL LOW (ref 3.5–5.0)
Alkaline Phosphatase: 29 U/L — ABNORMAL LOW (ref 38–126)
Anion gap: 10 (ref 5–15)
BUN: 21 mg/dL (ref 8–23)
CO2: 22 mmol/L (ref 22–32)
Calcium: 7.9 mg/dL — ABNORMAL LOW (ref 8.9–10.3)
Chloride: 103 mmol/L (ref 98–111)
Creatinine, Ser: 1.78 mg/dL — ABNORMAL HIGH (ref 0.61–1.24)
GFR, Estimated: 42 mL/min — ABNORMAL LOW (ref >60)
Glucose, Bld: 132 mg/dL — ABNORMAL HIGH (ref 70–99)
Potassium: 3.7 mmol/L (ref 3.5–5.1)
Sodium: 135 mmol/L (ref 135–145)
Total Bilirubin: 1.7 mg/dL — ABNORMAL HIGH (ref 0.3–1.2)
Total Protein: 5.2 g/dL — ABNORMAL LOW (ref 6.5–8.1)

## 2023-03-23 LAB — GLUCOSE, CAPILLARY
Glucose-Capillary: 125 mg/dL — ABNORMAL HIGH (ref 70–99)
Glucose-Capillary: 129 mg/dL — ABNORMAL HIGH (ref 70–99)

## 2023-03-23 LAB — HIV ANTIBODY (ROUTINE TESTING W REFLEX): HIV Screen 4th Generation wRfx: NONREACTIVE

## 2023-03-23 LAB — CBC
HCT: 30.6 % — ABNORMAL LOW (ref 39.0–52.0)
Hemoglobin: 10.6 g/dL — ABNORMAL LOW (ref 13.0–17.0)
MCH: 31.2 pg (ref 26.0–34.0)
MCHC: 34.6 g/dL (ref 30.0–36.0)
MCV: 90 fL (ref 80.0–100.0)
Platelets: 75 10*3/uL — ABNORMAL LOW (ref 150–400)
RBC: 3.4 MIL/uL — ABNORMAL LOW (ref 4.22–5.81)
RDW: 13.1 % (ref 11.5–15.5)
WBC: 9.9 10*3/uL (ref 4.0–10.5)
nRBC: 0 % (ref 0.0–0.2)

## 2023-03-23 LAB — HEMOGLOBIN A1C
Hgb A1c MFr Bld: 6.7 % — ABNORMAL HIGH (ref 4.8–5.6)
Mean Plasma Glucose: 145.59 mg/dL

## 2023-03-23 LAB — PROTIME-INR
INR: 1.4 — ABNORMAL HIGH (ref 0.8–1.2)
Prothrombin Time: 16.9 seconds — ABNORMAL HIGH (ref 11.4–15.2)

## 2023-03-23 LAB — APTT: aPTT: 33 seconds (ref 24–36)

## 2023-03-23 LAB — LACTIC ACID, PLASMA: Lactic Acid, Venous: 1.5 mmol/L (ref 0.5–1.9)

## 2023-03-23 LAB — CULTURE, BLOOD (ROUTINE X 2): Culture: NO GROWTH

## 2023-03-23 MED ORDER — ACETAMINOPHEN 500 MG PO TABS
1000.0000 mg | ORAL_TABLET | Freq: Once | ORAL | Status: AC
  Administered 2023-03-23: 1000 mg via ORAL
  Filled 2023-03-23: qty 2

## 2023-03-23 MED ORDER — ONDANSETRON HCL 4 MG/2ML IJ SOLN: 4.0000 mg | Freq: Four times a day (QID) | INTRAMUSCULAR | Status: DC | PRN

## 2023-03-23 MED ORDER — ACETAMINOPHEN 325 MG PO TABS
650.0000 mg | ORAL_TABLET | Freq: Four times a day (QID) | ORAL | Status: DC | PRN
  Administered 2023-03-23: 650 mg via ORAL
  Filled 2023-03-23: qty 2

## 2023-03-23 MED ORDER — SENNA 8.6 MG PO TABS
1.0000 | ORAL_TABLET | Freq: Every day | ORAL | Status: DC
  Administered 2023-03-23 – 2023-03-25 (×3): 8.6 mg via ORAL
  Filled 2023-03-23 (×3): qty 1

## 2023-03-23 MED ORDER — ENOXAPARIN SODIUM 40 MG/0.4ML IJ SOSY: 40.0000 mg | PREFILLED_SYRINGE | INTRAMUSCULAR | Status: DC

## 2023-03-23 MED ORDER — SODIUM CHLORIDE 0.9 % IV SOLN
2.0000 g | Freq: Two times a day (BID) | INTRAVENOUS | Status: DC
  Administered 2023-03-23 – 2023-03-24 (×3): 2 g via INTRAVENOUS
  Filled 2023-03-23 (×4): qty 12.5

## 2023-03-23 MED ORDER — MAGNESIUM SULFATE 2 GM/50ML IV SOLN
2.0000 g | Freq: Once | INTRAVENOUS | Status: AC
  Administered 2023-03-23: 2 g via INTRAVENOUS
  Filled 2023-03-23: qty 50

## 2023-03-23 MED ORDER — LACTATED RINGERS IV SOLN: 150.0000 mL/h | INTRAVENOUS | Status: DC

## 2023-03-23 MED ORDER — LACTATED RINGERS IV BOLUS
1000.0000 mL | Freq: Once | INTRAVENOUS | Status: AC
  Administered 2023-03-23: 1000 mL via INTRAVENOUS

## 2023-03-23 MED ORDER — TEMAZEPAM 15 MG PO CAPS: 15.0000 mg | ORAL_CAPSULE | Freq: Every evening | ORAL | Status: DC | PRN

## 2023-03-23 MED ORDER — SODIUM CHLORIDE 0.9% FLUSH: 3.0000 mL | INTRAVENOUS | Status: DC | PRN

## 2023-03-23 MED ORDER — SODIUM CHLORIDE 0.9% FLUSH
3.0000 mL | Freq: Two times a day (BID) | INTRAVENOUS | Status: DC
  Administered 2023-03-23 – 2023-03-25 (×5): 3 mL via INTRAVENOUS

## 2023-03-23 MED ORDER — SENNOSIDES-DOCUSATE SODIUM 8.6-50 MG PO TABS: 1.0000 | ORAL_TABLET | Freq: Every evening | ORAL | Status: DC | PRN

## 2023-03-23 MED ORDER — MORPHINE SULFATE (PF) 2 MG/ML IV SOLN: 1.0000 mg | INTRAVENOUS | Status: DC | PRN

## 2023-03-23 MED ORDER — TAMSULOSIN HCL 0.4 MG PO CAPS
0.4000 mg | ORAL_CAPSULE | Freq: Every day | ORAL | Status: DC
  Administered 2023-03-23 – 2023-03-25 (×3): 0.4 mg via ORAL
  Filled 2023-03-23 (×3): qty 1

## 2023-03-23 MED ORDER — MIRABEGRON ER 25 MG PO TB24
25.0000 mg | ORAL_TABLET | Freq: Every day | ORAL | Status: DC
  Administered 2023-03-23 – 2023-03-25 (×3): 25 mg via ORAL
  Filled 2023-03-23 (×3): qty 1

## 2023-03-23 MED ORDER — VANCOMYCIN HCL IN DEXTROSE 1-5 GM/200ML-% IV SOLN
1000.0000 mg | INTRAVENOUS | Status: DC
  Administered 2023-03-23: 1000 mg via INTRAVENOUS
  Filled 2023-03-23: qty 200

## 2023-03-23 MED ORDER — SODIUM CHLORIDE 0.45 % IV SOLN
INTRAVENOUS | Status: DC
  Filled 2023-03-23: qty 75

## 2023-03-23 MED ORDER — ONDANSETRON HCL 4 MG PO TABS
4.0000 mg | ORAL_TABLET | Freq: Four times a day (QID) | ORAL | Status: DC | PRN
  Administered 2023-03-23: 4 mg via ORAL
  Filled 2023-03-23: qty 1

## 2023-03-23 MED ORDER — ROSUVASTATIN CALCIUM 20 MG PO TABS
20.0000 mg | ORAL_TABLET | Freq: Every evening | ORAL | Status: DC
  Administered 2023-03-23 – 2023-03-24 (×2): 20 mg via ORAL
  Filled 2023-03-23 (×2): qty 1

## 2023-03-23 MED ORDER — VANCOMYCIN HCL IN DEXTROSE 1-5 GM/200ML-% IV SOLN: 1000.0000 mg | Freq: Once | INTRAVENOUS | Status: DC

## 2023-03-23 MED ORDER — POLYETHYLENE GLYCOL 3350 17 G PO PACK
17.0000 g | PACK | Freq: Every day | ORAL | Status: DC
  Administered 2023-03-23 – 2023-03-24 (×2): 17 g via ORAL
  Filled 2023-03-23 (×3): qty 1

## 2023-03-23 MED ORDER — INSULIN ASPART 100 UNIT/ML IJ SOLN: 0.0000 [IU] | Freq: Three times a day (TID) | INTRAMUSCULAR | Status: DC

## 2023-03-23 MED ORDER — OXYCODONE-ACETAMINOPHEN 5-325 MG PO TABS
1.0000 | ORAL_TABLET | ORAL | Status: DC | PRN
  Administered 2023-03-23: 2 via ORAL
  Filled 2023-03-23: qty 2

## 2023-03-23 MED ORDER — GABAPENTIN 300 MG PO CAPS
300.0000 mg | ORAL_CAPSULE | Freq: Two times a day (BID) | ORAL | Status: DC
  Administered 2023-03-23 – 2023-03-25 (×5): 300 mg via ORAL
  Filled 2023-03-23 (×5): qty 1

## 2023-03-23 MED ORDER — SODIUM BICARBONATE 8.4 % IV SOLN
100.0000 meq | Freq: Once | INTRAVENOUS | Status: AC
  Administered 2023-03-23: 100 meq via INTRAVENOUS
  Filled 2023-03-23: qty 50

## 2023-03-23 MED ORDER — LEVETIRACETAM 500 MG PO TABS
1000.0000 mg | ORAL_TABLET | Freq: Every day | ORAL | Status: DC
  Administered 2023-03-23 – 2023-03-24 (×2): 1000 mg via ORAL
  Filled 2023-03-23 (×2): qty 2

## 2023-03-23 MED ORDER — SODIUM CHLORIDE 0.9 % IV SOLN: 250.0000 mL | INTRAVENOUS | Status: DC | PRN

## 2023-03-23 MED ORDER — TIZANIDINE HCL 4 MG PO TABS: 4.0000 mg | ORAL_TABLET | Freq: Every day | ORAL | Status: DC

## 2023-03-23 MED ORDER — MAGNESIUM OXIDE -MG SUPPLEMENT 400 (240 MG) MG PO TABS
400.0000 mg | ORAL_TABLET | Freq: Two times a day (BID) | ORAL | Status: DC
  Administered 2023-03-23 – 2023-03-25 (×4): 400 mg via ORAL
  Filled 2023-03-23 (×4): qty 1

## 2023-03-23 MED ORDER — ASPIRIN 81 MG PO CHEW
81.0000 mg | CHEWABLE_TABLET | Freq: Once | ORAL | Status: AC
  Administered 2023-03-23: 81 mg via ORAL
  Filled 2023-03-23: qty 1

## 2023-03-23 MED ORDER — ACETAMINOPHEN 650 MG RE SUPP: 650.0000 mg | Freq: Four times a day (QID) | RECTAL | Status: DC | PRN

## 2023-03-23 NOTE — Progress Notes (Signed)
Pharmacy Antibiotic Note  Gordon Christian is a 62 y.o. male admitted on 03/22/2023 with sepsis.  Pharmacy has been consulted for cefepime dosing.  Already started on vanc.  Plan: Cefepime 2g IV Q12H.  Height: 5\' 6"  (167.6 cm) Weight: 78 kg (171 lb 15.3 oz) IBW/kg (Calculated) : 63.8  Temp (24hrs), Avg:99.5 F (37.5 C), Min:98.6 F (37 C), Max:100.6 F (38.1 C)  Recent Labs  Lab 03/22/23 0626 03/22/23 0657 03/22/23 1841 03/22/23 2037  WBC 12.7*  --  12.9*  --   CREATININE 1.80*  --  2.02*  --   LATICACIDVEN  --  1.2  --  1.2    Estimated Creatinine Clearance: 36.3 mL/min (A) (by C-G formula based on SCr of 2.02 mg/dL (H)).    No Known Allergies   Thank you for allowing pharmacy to be a part of this patient's care.  Vernard Gambles, PharmD, BCPS  03/23/2023 2:52 AM

## 2023-03-23 NOTE — Progress Notes (Signed)
Pharmacy Antibiotic Note  Gordon Christian is a 62 y.o. male for which pharmacy has been consulted for cefepime and vancomycin dosing for bacteremia.  Patient with a history of recurrent kidney stones, DM, CKD. Patient presenting with fever and chills. Patient seen this morning and diagnosed with UTI.  SCr 2.02>>1.78 WBC 12.7>12.9>9.9; LA 1.2>1.5; T 101  Plan: Vancomycin 1500mg  once given 6/22 in ED --Scr back to baseline, will schedule 1000 mg q24hr (eAUC 497.4) unless change in renal function Cefepime 2g q12h Monitor WBC, fever, renal function, cultures De-escalate when able Levels at steady state  Height: 5\' 6"  (167.6 cm) Weight: 78 kg (171 lb 15.3 oz) IBW/kg (Calculated) : 63.8  Temp (24hrs), Avg:99.6 F (37.6 C), Min:98.6 F (37 C), Max:101 F (38.3 C)  Recent Labs  Lab 03/22/23 0626 03/22/23 0657 03/22/23 1841 03/22/23 2037 03/23/23 0229 03/23/23 0524  WBC 12.7*  --  12.9*  --   --  9.9  CREATININE 1.80*  --  2.02*  --   --  1.78*  LATICACIDVEN  --  1.2  --  1.2 1.5  --      Estimated Creatinine Clearance: 41.2 mL/min (A) (by C-G formula based on SCr of 1.78 mg/dL (H)).    No Known Allergies  Microbiology results: Pending  Thank you for allowing pharmacy to be a part of this patient's care.  Delmar Landau, PharmD, BCPS 03/23/2023 10:16 AM ED Clinical Pharmacist -  [phone]

## 2023-03-23 NOTE — ED Notes (Signed)
Pt given tooth brush and tooth paste, lip balm, and lotion.

## 2023-03-23 NOTE — Progress Notes (Signed)
Pt being followed by [facility] for Sepsis protocol. 

## 2023-03-23 NOTE — Progress Notes (Addendum)
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 Myrtis Ser in 2007  Presented to the ED 6/22 in the p.m. with pressure burning on voiding-loin to groin type pain, strangury with difficulty fully passing urine-prior nephrolithiasis status post hospitalization-last stone 9 months previously Was evaluated on 622 in the emergency room CT stone study negative for hydronephrosis or stone WBC 12.7 he did not have sepsis criteria and and was sent home  Returned 2 hours later to the ED with temp max 105 dribbling urine discomfort and chills Found to be hypotensive tachycardic Rx ceftriaxone and broaden to Zosyn vancomycin such as Enterococcus Pseudomonas Found to have metabolic acidosis probably from sepsis  S;--Tells me he went to the restroom this morning--discomfort on straining and lower quadrant pain-reviewed last CT from 0 800 on 6/22 which showed no stones or blockages  O BP 109/60   Pulse 82   Temp (!) 101 F (38.3 C) (Rectal)   Resp (!) 21   Ht 5\' 6"  (1.676 m)   Wt 78 kg   SpO2 95%   BMI 27.75 kg/m  Quiet appearing male seems to be in some amount of pain-stoic-flat affect-no icterus no pallor Chest clear S1-S2 slight tachycardia Abdomen slightly painful in lower quadrant--- no rebound no guarding Moving all 4 limbs equally No lower extremity edema   P Start Myrbetriq and use Flomax Keep Foley  catheter in place as was placed in the emergency room secondary to abdominal pain and retention Given no stone/other issues at this time we will treat this as a sepsis from urinary cause and potentially passed stone--if worsening pain will need CT scan abdomen pelvis Escalated pain control to Percocet-hold IV pain meds 2/2 mild hypotension on admission but can reevaluate if needed Myrbetriq as above Discussed with girlfriend Asher Muir at the bedside who is a Engineer, civil (consulting) on rehab floor  No charge  Pleas Koch, MD [facility] 8:35 AM

## 2023-03-23 NOTE — H&P (Addendum)
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  Patient initially came to emergency department earlier with complaining of dysuria, increased urinary frequency urgency.  During that time her vitals were stable.  He was sent home with oral Keflex.  Later in the day he developed fever and chill with generalized weakness and unable to void urine.  He came to emergency department for evaluation. During my evaluation, patient denies any lower abdominal pain, flank pain, fever and chills.  He is able to void urine appropriately now.  Denies any chest pain, palpitation, shortness of breath and headache.  ED Course:  Initial presentation to ED patient is afebrile, tachycardic 102, hypotensive 76/52.  Blood pressure improved around 88/59 and able to maintain MAP above 65 after total 4 L of LR bolus in the ED. UA shows evidence of UTI.  Pending urine and blood culture. CMP significant for low bicarb 20 with normal anion gap 11, elevated creatinine 1.8.  Low mag 1.1 CBC remarkable for elevated WBC count 12.9 and low platelet 104. UA hazy appearance, nitrate positive, leukocyte and rare bacteria present-suggest UTI. Lactic acid 1.2 within normal range.  Chest x-ray no acute cardiopulmonary process. CT stone chaser showed no significant for any acute  finding, no hydronephrosis or ureteral calculus.  Small bilateral renal calculi, atherosclerosis and chronic diverticulosis.  In the ED sepsis protocol was activated and patient received total 4.3 L of LR.  Received 1 dose of vancomycin and Zosyn.  Pending blood and urine culture.  Review of Systems:  Review of Systems  Constitutional:  Positive for chills, fever and malaise/fatigue.  Respiratory:  Negative for cough.   Cardiovascular:  Negative for chest pain.  Genitourinary:  Positive for dysuria, frequency and urgency. Negative for flank pain and hematuria.  Musculoskeletal:  Negative for back pain and myalgias.  Neurological:  Negative for headaches.  Psychiatric/Behavioral:  The patient is not nervous/anxious.     Past Medical History:  Diagnosis Date   Arthritis    Diabetes mellitus without complication (HCC)    Essential hypertension 03/23/2023   History of kidney stones    Hypercholesteremia    Stroke [facility])     Past Surgical History:  Procedure Laterality Date   APPENDECTOMY     CHOLECYSTECTOMY     CYSTOSCOPY W/ URETERAL STENT PLACEMENT     KNEE ARTHROSCOPY Right    SHOULDER ARTHROSCOPY Right 1999   TOTAL KNEE ARTHROPLASTY Right 09/04/2022   Procedure: TOTAL KNEE ARTHROPLASTY;  Surgeon: Jene Every, MD;  Location: [facility];  Service: Orthopedics;  Laterality: Right;     reports that he has never smoked. He has never used smokeless tobacco. He reports current alcohol use. He reports that he does not use drugs.  No Known Allergies  Family History  Problem Relation Age of Onset   Hypertension Mother    Hypertension Father     Prior to  Admission medications   Medication Sig Start Date End Date Taking? Authorizing Provider  acetaminophen (TYLENOL) 650 MG CR tablet Take 1,300 mg by mouth every 8 (eight) hours as needed for pain.   Yes [provider]  aspirin 325 MG tablet Take 325 mg by mouth 2 (two) times daily.   Yes [provider]   Carboxymethylcellul-Glycerin (LUBRICATING EYE DROPS OP) Place 1 drop into both eyes daily as needed (dry eyes).   Yes [provider]  cephALEXin (KEFLEX) 500 MG capsule Take 1 capsule (500 mg total) by mouth 4 (four) times daily. Patient taking differently: Take 500 mg by mouth daily as needed (For infections). 03/22/23  Yes Henderly, Britni A, PA-C  docusate sodium (COLACE) 100 MG capsule Take 1 capsule (100 mg total) by mouth 2 (two) times daily as needed for mild constipation. 09/04/22  Yes Jene Every, MD  gabapentin (NEURONTIN) 300 MG capsule Take 300 mg by mouth 2 (two) times daily.   Yes [provider]  levETIRAcetam (KEPPRA) 1000 MG tablet Take 1,000 mg by mouth at bedtime.   Yes [provider]  metFORMIN (GLUCOPHAGE) 500 MG tablet Take 500 mg by mouth 2 (two) times daily.   Yes [provider]  ondansetron (ZOFRAN-ODT) 4 MG disintegrating tablet Take 1 tablet (4 mg total) by mouth every 8 (eight) hours as needed for nausea or vomiting. 03/22/23  Yes Henderly, Britni A, PA-C  rosuvastatin (CRESTOR) 20 MG tablet Take 20 mg by mouth every evening.   Yes [provider]  Semaglutide,0.25 or 0.5MG /DOS, 2 MG/3ML SOPN Inject 0.5 mg into the skin once a week. 02/06/23  Yes [provider]  tamsulosin (FLOMAX) 0.4 MG CAPS capsule Take 1 capsule (0.4 mg total) by mouth daily for 5 days. 03/22/23 03/27/23 Yes Henderly, Britni A, PA-C  temazepam (RESTORIL) 15 MG capsule Take 15 mg by mouth at bedtime as needed for sleep.   Yes [provider]  tiZANidine (ZANAFLEX) 4 MG tablet Take 4 mg by mouth at bedtime.   Yes [provider]  Vitamin D, Ergocalciferol, (DRISDOL) 1.25 MG (50000 UNIT) CAPS capsule Take 50,000 Units by mouth every Wednesday.   Yes [provider]  vitamin E 1000 UNIT capsule Take 1,000 Units by mouth daily.   Yes [provider]  oxyCODONE-acetaminophen (PERCOCET/ROXICET) 5-325 MG tablet Take 1  tablet by mouth every 6 (six) hours as needed for severe pain. 03/22/23   Henderly, Britni A, PA-C  phenazopyridine (PYRIDIUM) 200 MG tablet Take 1 tablet (200 mg total) by mouth 3 (three) times daily. Patient not taking: Reported on 03/22/2023 03/22/23   Henderly, Britni A, PA-C  polyethylene glycol (MIRALAX / GLYCOLAX) 17 g packet Take 17 g by mouth daily. Patient not taking: Reported on 03/22/2023 09/04/22   Jene Every, MD     Physical Exam: Vitals:   03/23/23 0345 03/23/23 0415 03/23/23 0515 03/23/23 0545  BP: 106/70 132/74 (!) 101/57 101/60  Pulse: 100 96 92 91  Resp: (!) 21 17 17 17   Temp: (!) 101 F (38.3 C)     TempSrc: Rectal     SpO2: 93% 98% 94% (!) 88%  Weight:      Height:        Physical Exam Constitutional:      General: He is not in acute distress.    Appearance: He is not ill-appearing.  Cardiovascular:     Rate and Rhythm: Normal rate.  Pulmonary:     Effort: Pulmonary effort is  normal.     Breath sounds: Normal breath sounds.  Abdominal:     General: Abdomen is flat. Bowel sounds are normal.     Palpations: Abdomen is soft.     Tenderness: There is no abdominal tenderness. There is no right CVA tenderness or left CVA tenderness.  Skin:    General: Skin is warm.     Capillary Refill: Capillary refill takes less than 2 seconds.  Neurological:     General: No focal deficit present.  Psychiatric:        Mood and Affect: Mood normal.      Labs on Admission: I have personally reviewed following labs and imaging studies  CBC: Recent Labs  Lab 03/22/23 0626 03/22/23 1841 03/23/23 0524  WBC 12.7* 12.9* 9.9  HGB 13.7 13.0 10.6*  HCT 41.2 38.7* 30.6*  MCV 89.4 89.2 90.0  PLT 137* 104* 75*   Basic Metabolic Panel: Recent Labs  Lab 03/22/23 0626 03/22/23 1841 03/23/23 0229 03/23/23 0524  NA 136 136  --  135  K 4.1 4.0  --  3.7  CL 105 100  --  103  CO2 20* 19*  --  22  GLUCOSE 132* 161*  --  132*  BUN 23 23  --  21  CREATININE 1.80* 2.02*   --  1.78*  CALCIUM 9.3 8.9  --  7.9*  MG  --   --  1.1* 1.5*   GFR: Estimated Creatinine Clearance: 41.2 mL/min (A) (by C-G formula based on SCr of 1.78 mg/dL (H)). Liver Function Tests: Recent Labs  Lab 03/22/23 0626 03/22/23 1841 03/23/23 0524  AST 26 22 17   ALT 25 21 17   ALKPHOS 39 39 29*  BILITOT 0.9 1.4* 1.7*  PROT 7.3 6.9 5.2*  ALBUMIN 4.2 3.8 2.7*   Recent Labs  Lab 03/22/23 0657 03/22/23 1841  LIPASE 61* 47   No results for input(s): "AMMONIA" in the last 168 hours. Coagulation Profile: Recent Labs  Lab 03/22/23 2037 03/23/23 0524  INR 1.2 1.4*   Cardiac Enzymes: No results for input(s): "CKTOTAL", "CKMB", "CKMBINDEX", "TROPONINI", "TROPONINIHS" in the last 168 hours. BNP (last 3 results) No results for input(s): "BNP" in the last 8760 hours. HbA1C: Recent Labs    03/23/23 0229  HGBA1C 6.7*   CBG: No results for input(s): "GLUCAP" in the last 168 hours. Lipid Profile: No results for input(s): "CHOL", "HDL", "LDLCALC", "TRIG", "CHOLHDL", "LDLDIRECT" in the last 72 hours. Thyroid Function Tests: No results for input(s): "TSH", "T4TOTAL", "FREET4", "T3FREE", "THYROIDAB" in the last 72 hours. Anemia Panel: No results for input(s): "VITAMINB12", "FOLATE", "FERRITIN", "TIBC", "IRON", "RETICCTPCT" in the last 72 hours. Urine analysis:    Component Value Date/Time   COLORURINE AMBER (A) 03/22/2023 1841   APPEARANCEUR CLOUDY (A) 03/22/2023 1841   LABSPEC 1.026 03/22/2023 1841   PHURINE 5.0 03/22/2023 1841   GLUCOSEU 50 (A) 03/22/2023 1841   HGBUR SMALL (A) 03/22/2023 1841   BILIRUBINUR NEGATIVE 03/22/2023 1841   KETONESUR NEGATIVE 03/22/2023 1841   PROTEINUR 100 (A) 03/22/2023 1841   NITRITE NEGATIVE 03/22/2023 1841   LEUKOCYTESUR LARGE (A) 03/22/2023 1841    Radiological Exams on Admission: I have personally reviewed images DG Chest 2 View  Result Date: 03/22/2023 CLINICAL DATA:  Suspected Sepsis EXAM: CHEST - 2 VIEW COMPARISON:  None Available.  FINDINGS: The heart and mediastinal contours are within normal limits. Low lung volumes. No focal consolidation. No pulmonary edema. No pleural effusion. No pneumothorax. No acute osseous abnormality. IMPRESSION: No active  cardiopulmonary disease. Electronically Signed   By: Tish Frederickson M.D.   On: 03/22/2023 22:08   CT Renal Stone Study  Result Date: 03/22/2023 CLINICAL DATA:  Abdominal and flank pain with stone suspected. Right lower quadrant pain EXAM: CT ABDOMEN AND PELVIS WITHOUT CONTRAST TECHNIQUE: Multidetector CT imaging of the abdomen and pelvis was performed following the standard protocol without IV contrast. RADIATION DOSE REDUCTION: This exam was performed according to the departmental dose-optimization program which includes automated exposure control, adjustment of the mA and/or kV according to patient size and/or use of iterative reconstruction technique. COMPARISON:  02/02/2005 FINDINGS: Lower chest:  Coronary atherosclerosis.  No acute finding. Hepatobiliary: No focal liver abnormality.Cholecystectomy. No biliary dilatation. Pancreas: Unremarkable. Spleen: Generous size with 15 cm craniocaudal span. Adrenals/Urinary Tract: Negative adrenals. Perinephric stranding bilaterally, nonspecific in symmetric-likely baseline. Punctate bilateral calculi. No hydronephrosis or ureteral stone. Unremarkable bladder. Stomach/Bowel: No obstruction. No appendicitis. Numerous distal colonic diverticula. Vascular/Lymphatic: No acute vascular abnormality. Scattered atheromatous calcification. No mass or adenopathy. Reproductive:Symmetric enlargement of the prostate gland. Other: No ascites or pneumoperitoneum. Musculoskeletal: No acute abnormalities. IMPRESSION: 1. No acute finding.  No hydronephrosis or ureteral calculus. 2. Small bilateral renal calculi. Atherosclerosis and colonic diverticulosis. Electronically Signed   By: Tiburcio Pea M.D.   On: 03/22/2023 08:20    EKG: My personal interpretation of  EKG shows: EKG show normal sinus rhythm with left bundle branch block which is unchanged as compared to previous EKG 08/29/2022.  Assessment/Plan: Principal Problem:   Severe sepsis (HCC) Active Problems:   History of renal stone   Complicated UTI (urinary tract infection)   Controlled type 2 diabetes mellitus without complication, without long-term current use of insulin (HCC)   AKI (acute kidney injury) (HCC)   High anion gap metabolic acidosis   CKD stage 3b, GFR 30-44 ml/min (HCC)   Essential hypertension   Dyslipidemia   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   History of seizure   Chronic pain   Chronic idiopathic thrombocytopenia (HCC)    Assessment and Plan: Severe sepsis secondary from complicated UTI -Patient has tachycardia, temperature 101, leukocytosis 12.1 and UA showed evidence of UTI.  Developed AKI from low perfusion.  Initial presentation blood pressure 76/52 required multiple LR bolus.  All of these parameters significant for severe sepsis as patient has evidence of endorgan damage. -Lactic acid within normal range. -Leukocytosis 12.9. -UA hazy appearance, nitrate positive, leukocyte and rare bacteria present-suggest UTI. -CT stone chaser ruled out any obstructive renal stone.  No evidence of hydronephrosis or ureteral calculus.  Small bilateral renal calculi present. -Urine and blood culture pending. - In the ED patient received Vanco and Zosyn. - As patient developing AKI switching Zosyn to cefepime per pharmacy protocol.  Continue vancomycin. - Base on blood and urine culture results continue appropriate antibiotic. -Continue LR 100 cc/h until hypotension normalized. -Continue to monitor urine output.  Foley in place. - Continue Tylenol 650 mg 4 times daily for mild to moderate pain and fever. - Continue to monitor CBC and CMP   Prerenal AKI on CKD stage IIIb -AKI trended up 1.8-2.02 over the course of few hours  in the ED. -Prerenal AKI in the setting of hypotension - Continue IV hydration  Anion gap metabolic acidosis - Low bicarb 19 and elevated anion gap 17.  Anion gap tubular acidosis secondary from in the setting of AKI. - Ordered IV bolus of bicarb 100 mEq once -Follow up with CMP in the  morning  Non-insulin dependent diabetes mellitus type 2 -A1c checked today 6.7 -At home patient is on metformin 500 mg twice daily and semaglutide once weekly. - Continue diabetic diet, POC blood glucose check 3 times daily with meal and bedtime and sliding scale SSI.  History of essential hypertension -Currently hypotensive in the setting of shock. - Home blood pressure regimen on hold  Dyslipidemia -Continue Crestor 20 mg daily  History of CVA with residual left-sided upper and lower extremity weakness/deficit -Continue aspirin 81 mg daily and Crestor 20 mg daily.  History of seizure -Patient reported taking Keppra 1000 mg at nighttime.  Resumed home dose.   Chronic pain syndrome -Holding home gabapentin and tizanidine in the setting of hypotension.  Chronic idiopathic thrombocytopenia -Chronic thrombocytopenia platelet count admitted 104-122 for last 1 year. - As patient currently on aspirin 81 mg daily continue to monitor for the platelet count.  History of insomnia - Patient reported taking Restoril as needed at bedtime.   -Ordered on Restoril 15 mg at bedtime as needed. Also to prevent withdrawal continue Restoril.  DVT prophylaxis: Lovenox Code Status: Full Code.  Discussed with patient. Diet: Diabetic diet Family Communication: none Disposition Plan: Follow-up with urine and blood culture for appropriate antibiotic coverage.  Plan to discharge home in 2 to 3 days. Consults: Pharmacy for vancomycin and cefepime dosing Admission status: Inpatient, Step Down Unit   Tereasa Coop, MD [facility] 03/23/2023, 6:24 AM    If 7PM-7AM, please contact  night-coverage [email or web address] Password [email or web address]

## 2023-03-23 NOTE — Progress Notes (Signed)
   03/23/23 1526  Vitals  Temp 98 F (36.7 C)  Temp Source Oral  BP 101/63  MAP (mmHg) 76  BP Location Left Arm  BP Method Automatic  Patient Position (if appropriate) Lying  Pulse Rate 86  Pulse Rate Source Monitor  ECG Heart Rate 86  Resp 20  MEWS COLOR  MEWS Score Color Green  Oxygen Therapy  SpO2 96 %  O2 Device Room Air  MEWS Score  MEWS Temp 0  MEWS Systolic 0  MEWS Pulse 0  MEWS RR 0  MEWS LOC 0  MEWS Score 0   Patient arrived fromED, with foley catheter, bag full 1000 ml emptied on arrival. Vital signs obtained and CCMD made aware . Sacral foam placed skin on buttocks/ sacrum pink on admission, bedside RN aware of patient arrival Rain [place], Randall An RN

## 2023-03-23 NOTE — Plan of Care (Signed)
  Problem: Fluid Volume: Goal: Hemodynamic stability will improve Outcome: Progressing   Problem: Clinical Measurements: Goal: Diagnostic test results will improve Outcome: Progressing Goal: Signs and symptoms of infection will decrease Outcome: Progressing   Problem: Respiratory: Goal: Ability to maintain adequate ventilation will improve Outcome: Progressing   Problem: Education: Goal: Ability to describe self-care measures that may prevent or decrease complications (Diabetes Survival Skills Education) will improve Outcome: Progressing Goal: Individualized Educational Video(s) Outcome: Progressing   Problem: Coping: Goal: Ability to adjust to condition or change in health will improve Outcome: Progressing   Problem: Fluid Volume: Goal: Ability to maintain a balanced intake and output will improve Outcome: Progressing   Problem: Health Behavior/Discharge Planning: Goal: Ability to identify and utilize available resources and services will improve Outcome: Progressing Goal: Ability to manage health-related needs will improve Outcome: Progressing   Problem: Metabolic: Goal: Ability to maintain appropriate glucose levels will improve Outcome: Progressing   Problem: Nutritional: Goal: Maintenance of adequate nutrition will improve Outcome: Progressing Goal: Progress toward achieving an optimal weight will improve Outcome: Progressing   Problem: Skin Integrity: Goal: Risk for impaired skin integrity will decrease Outcome: Progressing   Problem: Tissue Perfusion: Goal: Adequacy of tissue perfusion will improve Outcome: Progressing   

## 2023-03-23 NOTE — ED Notes (Signed)
ED TO INPATIENT HANDOFF REPORT  ED Nurse Name and Phone #: [phone]  S Name/Age/Gender Gordon Christian 62 y.o. male Room/Bed: 034C/034C  Code Status   Code Status: Full Code  Home/SNF/Other Home Patient oriented to: self, place, time, and situation Is this baseline? Yes   Triage Complete: Triage complete  Chief Complaint Sepsis [facility]) [A41.9]  Triage Note The pt was here earlier today and diagnosed with a uti.  When he went home he was only able to dribble a little urine  the last time he urinated was around 1600 after drinking lots of fluids  he has had tylenol and advil  he reports a temp of 105 earlier?  He has been having chills also   Allergies No Known Allergies  Level of Care/Admitting Diagnosis ED Disposition     ED Disposition  Admit   Condition  --   Comment  Hospital Area: [facility] [[id number]]  Level of Care: Progressive [102]  Admit to Progressive based on following criteria: MULTISYSTEM THREATS such as stable sepsis, metabolic/electrolyte imbalance with or without encephalopathy that is responding to early treatment.  May admit patient to [facility] or [facility] if equivalent level of care is available:: No  Covid Evaluation: Asymptomatic - no recent exposure (last 10 days) testing not required  Diagnosis: Sepsis [facility]) [[id number]]  Admitting Physician: Tereasa Coop [[id number]]  Attending Physician: Tereasa Coop [[id number]]  Certification:: I certify this patient will need inpatient services for at least 2 midnights  Estimated Length of Stay: 3          B Medical/Surgery History Past Medical History:  Diagnosis Date   Arthritis    Diabetes mellitus without complication (HCC)    Essential hypertension 03/23/2023   History of kidney stones    Hypercholesteremia    Stroke [facility])    Past Surgical History:  Procedure Laterality Date   APPENDECTOMY     CHOLECYSTECTOMY     CYSTOSCOPY W/ URETERAL STENT PLACEMENT     KNEE ARTHROSCOPY  Right    SHOULDER ARTHROSCOPY Right 1999   TOTAL KNEE ARTHROPLASTY Right 09/04/2022   Procedure: TOTAL KNEE ARTHROPLASTY;  Surgeon: Jene Every, MD;  Location: [facility];  Service: Orthopedics;  Laterality: Right;     A IV Location/Drains/Wounds Patient Lines/Drains/Airways Status     Active Line/Drains/Airways     Name Placement date Placement time Site Days   Peripheral IV 03/22/23 18 G Anterior;Left Forearm 03/22/23  2102  Forearm  1   Peripheral IV 03/22/23 18 G Anterior;Right Forearm 03/22/23  2156  Forearm  1            Intake/Output Last 24 hours  Intake/Output Summary (Last 24 hours) at 03/23/2023 1430 Last data filed at 03/23/2023 2952 Gross per 24 hour  Intake 5477.5 ml  Output 1000 ml  Net 4477.5 ml    Labs/Imaging Results for orders placed or performed during the hospital encounter of 03/22/23 (from the past 48 hour(s))  Lipase, blood     Status: None   Collection Time: 03/22/23  6:41 PM  Result Value Ref Range   Lipase 47 11 - 51 U/L    Comment: Performed at [facility], [address], [place], [place] [address]  Comprehensive metabolic panel     Status: Abnormal   Collection Time: 03/22/23  6:41 PM  Result Value Ref Range   Sodium 136 135 - 145 mmol/L   Potassium 4.0 3.5 - 5.1 mmol/L   Chloride 100 98 -  111 mmol/L   CO2 19 (L) 22 - 32 mmol/L   Glucose, Bld 161 (H) 70 - 99 mg/dL    Comment: Glucose reference range applies only to samples taken after fasting for at least 8 hours.   BUN 23 8 - 23 mg/dL   Creatinine, Ser 5.62 (H) 0.61 - 1.24 mg/dL   Calcium 8.9 8.9 - 13.0 mg/dL   Total Protein 6.9 6.5 - 8.1 g/dL   Albumin 3.8 3.5 - 5.0 g/dL   AST 22 15 - 41 U/L   ALT 21 0 - 44 U/L   Alkaline Phosphatase 39 38 - 126 U/L   Total Bilirubin 1.4 (H) 0.3 - 1.2 mg/dL   GFR, Estimated 36 (L) >60 mL/min    Comment: (NOTE) Calculated using the CKD-EPI Creatinine Equation (2021)    Anion gap 17 (H) 5 - 15    Comment: Performed at [facility]  Lab, [address], [place], [place] [address]  CBC     Status: Abnormal   Collection Time: 03/22/23  6:41 PM  Result Value Ref Range   WBC 12.9 (H) 4.0 - 10.5 K/uL   RBC 4.34 4.22 - 5.81 MIL/uL   Hemoglobin 13.0 13.0 - 17.0 g/dL   HCT 46.9 (L) 62.9 - 52.8 %   MCV 89.2 80.0 - 100.0 fL   MCH 30.0 26.0 - 34.0 pg   MCHC 33.6 30.0 - 36.0 g/dL   RDW 41.3 24.4 - 01.0 %   Platelets 104 (L) 150 - 400 K/uL    Comment: REPEATED TO VERIFY   nRBC 0.0 0.0 - 0.2 %    Comment: Performed at [facility], [address] [place], [place] [address]  Urinalysis, Routine w reflex microscopic -Urine, Clean Catch     Status: Abnormal   Collection Time: 03/22/23  6:41 PM  Result Value Ref Range   Color, Urine AMBER (A) YELLOW    Comment: BIOCHEMICALS MAY BE AFFECTED BY COLOR   APPearance CLOUDY (A) CLEAR   Specific Gravity, Urine 1.026 1.005 - 1.030   pH 5.0 5.0 - 8.0   Glucose, UA 50 (A) NEGATIVE mg/dL   Hgb urine dipstick SMALL (A) NEGATIVE   Bilirubin Urine NEGATIVE NEGATIVE   Ketones, ur NEGATIVE NEGATIVE mg/dL   Protein, ur 664 (A) NEGATIVE mg/dL   Nitrite NEGATIVE NEGATIVE   Leukocytes,Ua LARGE (A) NEGATIVE   RBC / HPF 21-50 0 - 5 RBC/hpf   WBC, UA >50 0 - 5 WBC/hpf   Bacteria, UA FEW (A) NONE SEEN   Squamous Epithelial / HPF 0-5 0 - 5 /HPF   WBC Clumps PRESENT    Mucus PRESENT     Comment: Performed at [facility], [address], [place], [place] [address]  Culture, blood (Routine x 2)     Status: None (Preliminary result)   Collection Time: 03/22/23  8:15 PM   Specimen: BLOOD RIGHT ARM  Result Value Ref Range   Specimen Description BLOOD RIGHT ARM    Special Requests      BOTTLES DRAWN AEROBIC AND ANAEROBIC Blood Culture adequate volume   Culture      NO GROWTH < 12 HOURS Performed at [facility], [address] [place], [place] [address]    Report Status PENDING   Culture, blood (Routine x 2)     Status: None (Preliminary result)   Collection Time: 03/22/23   8:31 PM   Specimen: BLOOD LEFT ARM  Result Value Ref  Range   Specimen Description BLOOD LEFT ARM    Special Requests      BOTTLES DRAWN AEROBIC ONLY Blood Culture adequate volume   Culture      NO GROWTH < 12 HOURS Performed at [facility], [address], [place], [place] [address]    Report Status PENDING   Lactic acid, plasma     Status: None   Collection Time: 03/22/23  8:37 PM  Result Value Ref Range   Lactic Acid, Venous 1.2 0.5 - 1.9 mmol/L    Comment: Performed at [facility], [address], [place], [place] [address]  Protime-INR     Status: None   Collection Time: 03/22/23  8:37 PM  Result Value Ref Range   Prothrombin Time 15.2 11.4 - 15.2 seconds   INR 1.2 0.8 - 1.2    Comment: (NOTE) INR goal varies based on device and disease states. Performed at [facility], [address], [place], [place] [address]   Lactic acid, plasma     Status: None   Collection Time: 03/23/23  2:29 AM  Result Value Ref Range   Lactic Acid, Venous 1.5 0.5 - 1.9 mmol/L    Comment: Performed at [facility], [address], [place], [place] [address]  Magnesium     Status: Abnormal   Collection Time: 03/23/23  2:29 AM  Result Value Ref Range   Magnesium 1.1 (L) 1.7 - 2.4 mg/dL    Comment: Performed at [facility], [address] [place], [place] [address]  Hemoglobin A1c     Status: Abnormal   Collection Time: 03/23/23  2:29 AM  Result Value Ref Range   Hgb A1c MFr Bld 6.7 (H) 4.8 - 5.6 %    Comment: (NOTE) Pre diabetes:          5.7%-6.4%  Diabetes:              >6.4%  Glycemic control for   <7.0% adults with diabetes    Mean Plasma Glucose 145.59 mg/dL    Comment: Performed at [facility], [address] [place], [place] [address]  HIV Antibody (routine testing w rflx)     Status: None   Collection Time: 03/23/23  5:24 AM  Result Value Ref Range   HIV Screen 4th Generation wRfx Non Reactive Non Reactive    Comment: Performed at  [facility], [address], [place], [place] [address]  Comprehensive metabolic panel     Status: Abnormal   Collection Time: 03/23/23  5:24 AM  Result Value Ref Range   Sodium 135 135 - 145 mmol/L   Potassium 3.7 3.5 - 5.1 mmol/L   Chloride 103 98 - 111 mmol/L   CO2 22 22 - 32 mmol/L   Glucose, Bld 132 (H) 70 - 99 mg/dL    Comment: Glucose reference range applies only to samples taken after fasting for at least 8 hours.   BUN 21 8 - 23 mg/dL   Creatinine, Ser 5.57 (H) 0.61 - 1.24 mg/dL   Calcium 7.9 (L) 8.9 - 10.3 mg/dL   Total Protein 5.2 (L) 6.5 - 8.1 g/dL   Albumin 2.7 (L) 3.5 - 5.0 g/dL   AST 17 15 - 41 U/L   ALT 17 0 - 44 U/L   Alkaline Phosphatase 29 (L) 38 - 126 U/L   Total Bilirubin 1.7 (H) 0.3 - 1.2 mg/dL   GFR, Estimated 42 (L) >  60 mL/min    Comment: (NOTE) Calculated using the CKD-EPI Creatinine Equation (2021)    Anion gap 10 5 - 15    Comment: Performed at [facility], [address], [place], [place] [address]  CBC     Status: Abnormal   Collection Time: 03/23/23  5:24 AM  Result Value Ref Range   WBC 9.9 4.0 - 10.5 K/uL   RBC 3.40 (L) 4.22 - 5.81 MIL/uL   Hemoglobin 10.6 (L) 13.0 - 17.0 g/dL   HCT 60.4 (L) 54.0 - 98.1 %   MCV 90.0 80.0 - 100.0 fL   MCH 31.2 26.0 - 34.0 pg   MCHC 34.6 30.0 - 36.0 g/dL   RDW 19.1 47.8 - 29.5 %   Platelets 75 (L) 150 - 400 K/uL    Comment: Immature Platelet Fraction may be clinically indicated, consider ordering this additional test [id number] REPEATED TO VERIFY    nRBC 0.0 0.0 - 0.2 %    Comment: Performed at [facility], [address], [place], [place] [address]  APTT     Status: None   Collection Time: 03/23/23  5:24 AM  Result Value Ref Range   aPTT 33 24 - 36 seconds    Comment: Performed at [facility], [address], [place], [place] [address]  Protime-INR     Status: Abnormal   Collection Time: 03/23/23  5:24 AM  Result Value Ref Range   Prothrombin Time 16.9 (H) 11.4 - 15.2  seconds   INR 1.4 (H) 0.8 - 1.2    Comment: (NOTE) INR goal varies based on device and disease states. Performed at [facility], [address] [place], [place] [address]   Magnesium     Status: Abnormal   Collection Time: 03/23/23  5:24 AM  Result Value Ref Range   Magnesium 1.5 (L) 1.7 - 2.4 mg/dL    Comment: Performed at [facility], [address], [place], [place] [address]  CBG monitoring, ED     Status: Abnormal   Collection Time: 03/23/23  7:47 AM  Result Value Ref Range   Glucose-Capillary 110 (H) 70 - 99 mg/dL    Comment: Glucose reference range applies only to samples taken after fasting for at least 8 hours.  CBG monitoring, ED     Status: Abnormal   Collection Time: 03/23/23 11:44 AM  Result Value Ref Range   Glucose-Capillary 130 (H) 70 - 99 mg/dL    Comment: Glucose reference range applies only to samples taken after fasting for at least 8 hours.   DG Chest 2 View  Result Date: 03/22/2023 CLINICAL DATA:  Suspected Sepsis EXAM: CHEST - 2 VIEW COMPARISON:  None Available. FINDINGS: The heart and mediastinal contours are within normal limits. Low lung volumes. No focal consolidation. No pulmonary edema. No pleural effusion. No pneumothorax. No acute osseous abnormality. IMPRESSION: No active cardiopulmonary disease. Electronically Signed   By: Tish Frederickson M.D.   On: 03/22/2023 22:08   CT Renal Stone Study  Result Date: 03/22/2023 CLINICAL DATA:  Abdominal and flank pain with stone suspected. Right lower quadrant pain EXAM: CT ABDOMEN AND PELVIS WITHOUT CONTRAST TECHNIQUE: Multidetector CT imaging of the abdomen and pelvis was performed following the standard protocol without IV contrast. RADIATION DOSE REDUCTION: This exam was performed according to the departmental dose-optimization program which includes automated exposure control, adjustment of the mA and/or kV according to patient size and/or use of iterative reconstruction technique. COMPARISON:  02/02/2005 FINDINGS: Lower chest:  Coronary atherosclerosis.  No acute finding. Hepatobiliary: No focal liver abnormality.Cholecystectomy. No biliary dilatation. Pancreas: Unremarkable. Spleen: Generous size with 15 cm craniocaudal span. Adrenals/Urinary Tract: Negative adrenals. Perinephric stranding bilaterally, nonspecific in symmetric-likely baseline. Punctate bilateral calculi. No hydronephrosis or ureteral stone. Unremarkable bladder. Stomach/Bowel: No obstruction. No appendicitis. Numerous distal colonic diverticula. Vascular/Lymphatic: No acute vascular abnormality. Scattered atheromatous calcification. No mass or adenopathy. Reproductive:Symmetric enlargement of the prostate gland. Other: No ascites or pneumoperitoneum. Musculoskeletal: No acute abnormalities. IMPRESSION: 1. No acute finding.  No hydronephrosis or ureteral calculus. 2. Small bilateral renal calculi. Atherosclerosis and colonic diverticulosis. Electronically Signed   By: Tiburcio Pea M.D.   On: 03/22/2023 08:20    Pending Labs Unresulted Labs (From admission, onward)    None       Vitals/Pain Today's Vitals   03/23/23 1130 03/23/23 1200 03/23/23 1300 03/23/23 1400  BP: 101/86 (!) 99/59 101/62 104/63  Pulse: 85 82 80 80  Resp: 14 (!) 22 15 14   Temp:      TempSrc:      SpO2: 94% 95% 93% 93%  Weight:      Height:      PainSc:        Isolation Precautions No active isolations  Medications Medications  lactated ringers infusion ( Intravenous New Bag/Given 03/23/23 1131)  rosuvastatin (CRESTOR) tablet 20 mg (has no administration in time range)  temazepam (RESTORIL) capsule 15 mg (has no administration in time range)  levETIRAcetam (KEPPRA) tablet 1,000 mg (has no administration in time range)  tamsulosin (FLOMAX) capsule 0.4 mg (0.4 mg Oral Given 03/23/23 0850)  enoxaparin (LOVENOX) injection 40 mg (40 mg Subcutaneous Not Given 03/23/23 1349)  sodium chloride flush (NS) 0.9 % injection 3 mL (3 mLs Intravenous  Given 03/23/23 0910)  sodium chloride flush (NS) 0.9 % injection 3 mL (has no administration in time range)  0.9 %  sodium chloride infusion (has no administration in time range)  acetaminophen (TYLENOL) tablet 650 mg (has no administration in time range)    Or  acetaminophen (TYLENOL) suppository 650 mg (has no administration in time range)  senna-docusate (Senokot-S) tablet 1 tablet (has no administration in time range)  ondansetron (ZOFRAN) tablet 4 mg (4 mg Oral Given 03/23/23 0850)    Or  ondansetron (ZOFRAN) injection 4 mg ( Intravenous See Alternative 03/23/23 0850)  insulin aspart (novoLOG) injection 0-6 Units ( Subcutaneous Not Given 03/23/23 1149)  ceFEPIme (MAXIPIME) 2 g in sodium chloride 0.9 % 100 mL IVPB (0 g Intravenous Stopped 03/23/23 0743)  aspirin chewable tablet 81 mg (has no administration in time range)  gabapentin (NEURONTIN) capsule 300 mg (300 mg Oral Given 03/23/23 0851)  mirabegron ER (MYRBETRIQ) tablet 25 mg (25 mg Oral Given 03/23/23 1125)  oxyCODONE-acetaminophen (PERCOCET/ROXICET) 5-325 MG per tablet 1-2 tablet (2 tablets Oral Given 03/23/23 0850)  vancomycin (VANCOCIN) IVPB 1000 mg/200 mL premix (has no administration in time range)  lactated ringers bolus 2,340 mL (0 mLs Intravenous Stopped 03/22/23 2347)  piperacillin-tazobactam (ZOSYN) IVPB 3.375 g (0 g Intravenous Stopped 03/22/23 2232)  vancomycin (VANCOREADY) IVPB 1500 mg/300 mL (0 mg Intravenous Stopped 03/23/23 0035)  lactated ringers bolus 1,000 mL (0 mLs Intravenous Stopped 03/23/23 0154)  lactated ringers bolus 1,000 mL (0 mLs Intravenous Stopped 03/23/23 0743)  sodium bicarbonate injection 100 mEq (100 mEq Intravenous Given 03/23/23 0330)  magnesium sulfate IVPB 2 g 50 mL (0 g Intravenous Stopped 03/23/23 0743)  acetaminophen (TYLENOL) tablet 1,000 mg (1,000 mg Oral Given  03/23/23 0356)    Mobility walks     Focused Assessments Lower abdominal pain Bowel sounds WDL   R Recommendations: See Admitting  Provider Note  Report given to:   Additional Notes:

## 2023-03-24 DIAGNOSIS — R6520 Severe sepsis without septic shock: Secondary | ICD-10-CM | POA: Diagnosis not present

## 2023-03-24 DIAGNOSIS — A419 Sepsis, unspecified organism: Secondary | ICD-10-CM | POA: Diagnosis not present

## 2023-03-24 LAB — CBC WITH DIFFERENTIAL/PLATELET
Abs Immature Granulocytes: 0.07 10*3/uL (ref 0.00–0.07)
Basophils Absolute: 0 10*3/uL (ref 0.0–0.1)
Basophils Relative: 0 %
Eosinophils Absolute: 0 10*3/uL (ref 0.0–0.5)
Eosinophils Relative: 0 %
HCT: 32.4 % — ABNORMAL LOW (ref 39.0–52.0)
Hemoglobin: 10.7 g/dL — ABNORMAL LOW (ref 13.0–17.0)
Immature Granulocytes: 1 %
Lymphocytes Relative: 11 %
Lymphs Abs: 1 10*3/uL (ref 0.7–4.0)
MCH: 30.1 pg (ref 26.0–34.0)
MCHC: 33 g/dL (ref 30.0–36.0)
MCV: 91.3 fL (ref 80.0–100.0)
Monocytes Absolute: 0.9 10*3/uL (ref 0.1–1.0)
Monocytes Relative: 9 %
Neutro Abs: 7.6 10*3/uL (ref 1.7–7.7)
Neutrophils Relative %: 79 %
Platelets: 81 10*3/uL — ABNORMAL LOW (ref 150–400)
RBC: 3.55 MIL/uL — ABNORMAL LOW (ref 4.22–5.81)
RDW: 12.9 % (ref 11.5–15.5)
WBC: 9.6 10*3/uL (ref 4.0–10.5)
nRBC: 0 % (ref 0.0–0.2)

## 2023-03-24 LAB — BASIC METABOLIC PANEL
Anion gap: 7 (ref 5–15)
BUN: 18 mg/dL (ref 8–23)
CO2: 21 mmol/L — ABNORMAL LOW (ref 22–32)
Calcium: 8 mg/dL — ABNORMAL LOW (ref 8.9–10.3)
Chloride: 105 mmol/L (ref 98–111)
Creatinine, Ser: 1.64 mg/dL — ABNORMAL HIGH (ref 0.61–1.24)
GFR, Estimated: 46 mL/min — ABNORMAL LOW (ref >60)
Glucose, Bld: 102 mg/dL — ABNORMAL HIGH (ref 70–99)
Potassium: 4.1 mmol/L (ref 3.5–5.1)
Sodium: 133 mmol/L — ABNORMAL LOW (ref 135–145)

## 2023-03-24 LAB — GLUCOSE, CAPILLARY
Glucose-Capillary: 101 mg/dL — ABNORMAL HIGH (ref 70–99)
Glucose-Capillary: 133 mg/dL — ABNORMAL HIGH (ref 70–99)
Glucose-Capillary: 82 mg/dL (ref 70–99)
Glucose-Capillary: 142 mg/dL — ABNORMAL HIGH (ref 70–99)

## 2023-03-24 LAB — CULTURE, BLOOD (ROUTINE X 2)

## 2023-03-24 LAB — URINE CULTURE: Culture: 90000 — AB

## 2023-03-24 MED ORDER — CIPROFLOXACIN HCL 500 MG PO TABS
500.0000 mg | ORAL_TABLET | Freq: Two times a day (BID) | ORAL | Status: DC
  Administered 2023-03-24 – 2023-03-25 (×3): 500 mg via ORAL
  Filled 2023-03-24 (×3): qty 1

## 2023-03-24 MED ORDER — CHLORHEXIDINE GLUCONATE CLOTH 2 % EX PADS: 6.0000 | MEDICATED_PAD | Freq: Every day | CUTANEOUS | Status: DC

## 2023-03-24 NOTE — Progress Notes (Signed)
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 Myrtis Ser in 2007   Presented to the ED 6/22 in the p.m. with pressure burning on voiding-loin to groin type pain, strangury with difficulty fully passing urine-prior nephrolithiasis status post hospitalization-last stone 9 months previously Was evaluated on 622 in the emergency room CT stone study negative for hydronephrosis or stone WBC 12.7 he did not have sepsis criteria and and was sent home   Returned 2 hours later to the ED with temp max 105 dribbling urine discomfort and chills Found to be hypotensive tachycardic Rx ceftriaxone and broaden to Zosyn vancomycin such as Enterococcus Pseudomonas Found to have metabolic acidosis probably from sepsis  Hospital-Problem based course  Sepsis on admission secondary to UTI, Klebsiella 90,000 CFU  narrow to ciprofloxacin 500 twice daily, cut back saline 50 cc/H DC Foley--would not use pyridium long-term in the outpatient setting unless guided by urology with regards to the same  Prior nephrolithiasis Outpatient follow-up with [facility] physician-no current concerns Continue Myrbetriq that was started this admission 25 daily  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 Continue tizanidine  AKI superimposed on CKD 3B --GFR on admit 36 Creatinine trending closer to normal-fluids as above Mild metabolic acidosis secondary to reequilibration  Hypomagnesemia Replace with p.o. 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 Resumed gabapentin 300 twice daily, Keppra 1000 at bedtime, Restoril 15 at bedtime as needed  DVT prophylaxis: Lovenox Code Status: Full Family Communication: Girlfriend at bedside Disposition:  Status is: Inpatient Remains inpatient appropriate because:   Requires 24 hours observation to ensure stable and removal of Foley-likely discharge 6/25    Subjective: Awake coherent feels looks much better got some sleep pain is diminished complaining of some right knee issues mildly swollen no distress   Objective: Vitals:   03/23/23 2312 03/24/23 0400 03/24/23 0744 03/24/23 0853  BP: 94/62 (!) 97/49 120/71   Pulse: 80 73 72 78  Resp: 19 14 16    Temp: 99.7 F (37.6 C) 98.9 F (37.2 C) 98.1 F (36.7 C)   TempSrc: Oral Oral Oral   SpO2: 92% 95% 90%   Weight:      Height:        Intake/Output Summary (Last 24 hours) at 03/24/2023 1009 Last data filed at 03/24/2023 0837 Gross per 24 hour  Intake 2533.88 ml  Output 3950 ml  Net -1416.12 ml   Filed Weights   03/22/23 1837  Weight: 78 kg    Examination:  EOMI NCAT looks better overall improved Chest clear no added sound S1-S2 no murmur telemetry shows NSR Abdomen soft no rebound Right knee slightly more swollen than left not warm  Neuro intact  Data Reviewed: personally reviewed   CBC    Component Value Date/Time   WBC 9.6 03/24/2023 0108   RBC 3.55 (L) 03/24/2023 0108   HGB 10.7 (L) 03/24/2023 0108    HCT 32.4 (L) 03/24/2023 0108   PLT 81 (L) 03/24/2023 0108   MCV 91.3 03/24/2023 0108   MCH 30.1 03/24/2023 0108   MCHC 33.0 03/24/2023 0108   RDW 12.9 03/24/2023 0108   LYMPHSABS 1.0 03/24/2023 0108   MONOABS 0.9 03/24/2023 0108   EOSABS 0.0 03/24/2023 0108   BASOSABS 0.0 03/24/2023 0108      Latest Ref Rng & Units 03/24/2023    1:08 AM 03/23/2023    5:24 AM 03/22/2023    6:41 PM  CMP  Glucose 70 - 99 mg/dL 528  413  244   BUN 8 - 23 mg/dL 18  21  23    Creatinine 0.61 - 1.24 mg/dL 0.10  2.72  5.36   Sodium 135 - 145 mmol/L 133  135  136   Potassium 3.5 - 5.1 mmol/L 4.1  3.7  4.0   Chloride 98 - 111 mmol/L 105  103  100   CO2 22 - 32 mmol/L 21  22  19    Calcium 8.9 - 10.3 mg/dL 8.0  7.9  8.9   Total Protein 6.5 - 8.1 g/dL  5.2  6.9   Total Bilirubin 0.3 - 1.2 mg/dL  1.7  1.4   Alkaline Phos 38 - 126 U/L  29  39   AST 15 - 41 U/L  17  22   ALT 0 - 44 U/L  17  21      Radiology Studies: DG Chest 2 View  Result Date: 03/22/2023 CLINICAL DATA:  Suspected Sepsis EXAM: CHEST - 2 VIEW COMPARISON:  None Available. FINDINGS: The heart and mediastinal contours are within normal limits. Low lung volumes. No focal consolidation. No pulmonary edema. No pleural effusion. No pneumothorax. No acute osseous abnormality. IMPRESSION: No active cardiopulmonary disease. Electronically Signed   By: Tish Frederickson M.D.   On: 03/22/2023 22:08     Scheduled Meds:  Chlorhexidine Gluconate Cloth  6 each Topical Daily   ciprofloxacin  500 mg Oral BID   enoxaparin (LOVENOX) injection  40 mg Subcutaneous Q24H   gabapentin  300 mg Oral BID   insulin aspart  0-6 Units Subcutaneous TID WC   levETIRAcetam  1,000 mg Oral QHS   magnesium oxide  400 mg Oral BID   mirabegron ER  25 mg Oral Daily   polyethylene glycol  17 g Oral Daily   rosuvastatin  20 mg Oral QPM   senna  1 tablet Oral Daily   sodium chloride flush  3 mL Intravenous Q12H   tamsulosin  0.4 mg Oral Daily   Continuous Infusions:  sodium  chloride     lactated ringers 125 mL/hr at 03/24/23 0501     LOS: 1 day   Time spent: 41  Rhetta Mura, MD [facility] To contact the attending provider between 7A-7P or the covering provider during after hours 7P-7A, please log into the web site [email or web address] and access using universal [place] password for that web site. If you do not have the password, please call the hospital operator.  03/24/2023, 10:09 AM

## 2023-03-25 ENCOUNTER — Telehealth (HOSPITAL_BASED_OUTPATIENT_CLINIC_OR_DEPARTMENT_OTHER): Payer: Self-pay | Admitting: *Deleted

## 2023-03-25 DIAGNOSIS — A419 Sepsis, unspecified organism: Secondary | ICD-10-CM | POA: Diagnosis not present

## 2023-03-25 DIAGNOSIS — R6520 Severe sepsis without septic shock: Secondary | ICD-10-CM | POA: Diagnosis not present

## 2023-03-25 LAB — BASIC METABOLIC PANEL
Anion gap: 9 (ref 5–15)
BUN: 19 mg/dL (ref 8–23)
CO2: 21 mmol/L — ABNORMAL LOW (ref 22–32)
Calcium: 8.3 mg/dL — ABNORMAL LOW (ref 8.9–10.3)
Chloride: 103 mmol/L (ref 98–111)
Creatinine, Ser: 1.5 mg/dL — ABNORMAL HIGH (ref 0.61–1.24)
GFR, Estimated: 52 mL/min — ABNORMAL LOW (ref >60)
Glucose, Bld: 145 mg/dL — ABNORMAL HIGH (ref 70–99)
Potassium: 3.7 mmol/L (ref 3.5–5.1)
Sodium: 133 mmol/L — ABNORMAL LOW (ref 135–145)

## 2023-03-25 LAB — CBC WITH DIFFERENTIAL/PLATELET
Abs Immature Granulocytes: 0.03 10*3/uL (ref 0.00–0.07)
Basophils Absolute: 0 10*3/uL (ref 0.0–0.1)
Basophils Relative: 0 %
Eosinophils Absolute: 0.1 10*3/uL (ref 0.0–0.5)
Eosinophils Relative: 1 %
HCT: 33.1 % — ABNORMAL LOW (ref 39.0–52.0)
Hemoglobin: 11.3 g/dL — ABNORMAL LOW (ref 13.0–17.0)
Immature Granulocytes: 0 %
Lymphocytes Relative: 11 %
Lymphs Abs: 0.8 10*3/uL (ref 0.7–4.0)
MCH: 29.9 pg (ref 26.0–34.0)
MCHC: 34.1 g/dL (ref 30.0–36.0)
MCV: 87.6 fL (ref 80.0–100.0)
Monocytes Absolute: 0.7 10*3/uL (ref 0.1–1.0)
Monocytes Relative: 9 %
Neutro Abs: 5.7 10*3/uL (ref 1.7–7.7)
Neutrophils Relative %: 79 %
Platelets: 105 10*3/uL — ABNORMAL LOW (ref 150–400)
RBC: 3.78 MIL/uL — ABNORMAL LOW (ref 4.22–5.81)
RDW: 12.5 % (ref 11.5–15.5)
WBC: 7.3 10*3/uL (ref 4.0–10.5)
nRBC: 0 % (ref 0.0–0.2)

## 2023-03-25 LAB — GLUCOSE, CAPILLARY
Glucose-Capillary: 95 mg/dL (ref 70–99)
Glucose-Capillary: 96 mg/dL (ref 70–99)
Glucose-Capillary: 96 mg/dL (ref 70–99)

## 2023-03-25 LAB — CULTURE, BLOOD (ROUTINE X 2)

## 2023-03-25 MED ORDER — CIPROFLOXACIN HCL 500 MG PO TABS: 500.0000 mg | ORAL_TABLET | Freq: Two times a day (BID) | ORAL | 0 refills | Status: AC

## 2023-03-25 MED ORDER — OXYBUTYNIN CHLORIDE ER 5 MG PO TB24: 5.0000 mg | ORAL_TABLET | Freq: Every day | ORAL | 0 refills | Status: AC

## 2023-03-25 NOTE — TOC Transition Note (Signed)
Transition of Care (TOC) - CM/SW Discharge Note Donn Pierini RN, BSN Transitions of Care Unit 4E- RN Case Manager See Treatment Team for direct phone #   Patient Details  Name: Gordon Christian MRN: [id number] Date of Birth: 1961/07/07    Clinical Narrative:    Transition of Care [facility]) - Inpatient Brief Assessment  Pt stable for transition home today-   Transition of Care Asessment: Insurance and Status: Insurance coverage has been reviewed Patient has primary care physician: Yes Home environment has been reviewed: home Prior level of function:: independent Prior/Current Home Services: No current home services Social Determinants of Health Reivew: SDOH reviewed no interventions necessary Readmission risk has been reviewed: Yes Transition of care needs: no transition of care needs at this time           Patient Goals and CMS Choice    Return home, N/A  Discharge Placement                 Home        Discharge Plan and Services Additional resources added to the After Visit Summary for                                       Social Determinants of Health (SDOH) Interventions SDOH Screenings   Food Insecurity: No Food Insecurity (03/25/2023)  Housing: Low Risk  (03/25/2023)  Transportation Needs: No Transportation Needs (03/25/2023)  Utilities: Not At Risk (03/25/2023)  Tobacco Use: Low Risk  (03/23/2023)     Readmission Risk Interventions    03/25/2023   12:29 PM  Readmission Risk Prevention Plan  Transportation Screening Complete  PCP or Specialist Appt within 5-7 Days Complete  Home Care Screening Complete  Medication Review (RN CM) Complete

## 2023-03-25 NOTE — Discharge Summary (Signed)
Physician Discharge Summary  Gordon Christian ZOX:[id number] DOB: 1961-03-07 DOA: 03/22/2023  PCP: Teodoro Spray, MD  Admit date: 03/22/2023 Discharge date: 03/25/2023  Time spent: 36 minutes  Recommendations for Outpatient Follow-up:  Completion of ciprofloxacin required-follow-up in the outpatient setting with [facility] urologist Please consider workup of idiopathic thrombocytopenia as an outpatient-platelets rebounded and improved at time of discharge  Discharge Diagnoses:  MAIN problem for hospitalization   Sepsis on admission secondary to Klebsiella urinary infection Complicated prior urological history Prior CVA + prior seizures related Chronic pain CKD 3B Impaired glucose tolerance  Please see below for itemized issues addressed in HOpsital- refer to other progress notes for clarity if needed  Discharge Condition: Improved  Diet recommendation: Heart healthy  Filed Weights   03/22/23 1837  Weight: 78 kg    History of present illness:  63 year old male disabled since 2012 secondary to CVA previously worked for [employer] 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 Myrtis Ser in 2007   Presented to the ED 6/22 in the p.m. with pressure burning on voiding-loin to groin type pain, strangury with difficulty fully passing urine-prior nephrolithiasis status post hospitalization-last stone 9 months previously Was evaluated on 622 in the emergency room CT stone study negative for hydronephrosis or stone WBC 12.7 he did not have sepsis criteria and and was sent home   Returned 2 hours later to the ED  with temp max 105 dribbling urine discomfort and chills Found to be hypotensive tachycardic Rx ceftriaxone and broaden to Zosyn vancomycin such as Enterococcus Pseudomonas Found to have metabolic acidosis probably from sepsis  Hospital Course:  Sepsis on admission secondary to UTI, Klebsiella 90,000 CFU  narrow to ciprofloxacin 500 twice daily,--- saline was discontinued at time of discharge DC Foley--would not use pyridium long-term in the outpatient setting unless guided by urology with regards to the same-I have discontinued this medication   Prior nephrolithiasis--strangury Outpatient follow-up with [facility] physician--- it was felt that he may be retaining urine and he was bladder scanned and voided spontaneously with some mild discomfort at tip of penis-he was recommended to follow-up with his outpatient urologist-he was also recommended to start Myrbetriq, continue Flomax and discontinue Pyridium as above   Idiopathic 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 Resumed gabapentin 300 twice daily, Keppra 1000 at bedtime, Restoril 15 at bedtime as needed   Discharge Exam: Vitals:   03/25/23 0803 03/25/23 1126  BP: 102/75 130/75  Pulse:  66  Resp: 18   Temp: 98.6 F (37 C)  98 F (36.7 C)  SpO2:  99%  Subj on day of d/c   Awake coherent no distress looks well feels well but having some discomfort in abdomen/bladder No CVA tenderness Fullness in lower abdomen-slightly more tender with deep palpation-midline scar from xiphisternum to umbilicus-from prior gallbladder surgery-slightly distended Neuro intact moving all 4 limbs equally No icterus no pallor no wheeze no rales no rhonchi   Discharge Instructions   Discharge Instructions     Diet - low sodium heart healthy   Complete by: As directed    Discharge instructions   Complete by: As directed    Please complete entire course of ciprofloxacin-you have been prescribed oxybutynin as well to help with the sense of fullness in your bladder-please continue to take your Flomax in addition Please follow-up with your [facility] urologist for further management within a week-call his office before you leave the hospital to ensure that you have follow-up I would not take Pyridium long-term this is a urinary antiseptic and because of its diet nature, it is not a good long-term strategy for pain while passing urine Please present back to the emergency room if you have fever chills nausea vomiting or other issues   Increase activity slowly   Complete by: As directed       Allergies as of 03/25/2023   No Known Allergies      Medication List     STOP taking these medications    cephALEXin 500 MG capsule Commonly known as: KEFLEX   phenazopyridine 200 MG tablet Commonly known as: PYRIDIUM       TAKE these medications    acetaminophen 650 MG CR tablet Commonly known as: TYLENOL Take 1,300 mg by mouth every 8 (eight) hours as needed for pain.   aspirin 325 MG tablet Take 325 mg by mouth 2 (two) times daily.   ciprofloxacin 500 MG tablet Commonly known as: CIPRO Take 1 tablet (500 mg total) by mouth 2 (two) times daily for 3 days.   docusate sodium 100 MG capsule Commonly known as:  Colace Take 1 capsule (100 mg total) by mouth 2 (two) times daily as needed for mild constipation.   gabapentin 300 MG capsule Commonly known as: NEURONTIN Take 300 mg by mouth 2 (two) times daily.   levETIRAcetam 1000 MG tablet Commonly known as: KEPPRA Take 1,000 mg by mouth at bedtime.   LUBRICATING EYE DROPS OP Place 1 drop into both eyes daily as needed (dry eyes).   metFORMIN 500 MG tablet Commonly known as: GLUCOPHAGE Take 500 mg by mouth 2 (two) times daily.   ondansetron 4 MG disintegrating tablet Commonly known as: ZOFRAN-ODT Take 1 tablet (4 mg total) by mouth every 8 (eight) hours as needed for nausea or vomiting.   oxybutynin 5 MG 24 hr tablet Commonly known as: DITROPAN-XL Take 1 tablet (5 mg total) by mouth at bedtime.   oxyCODONE-acetaminophen 5-325 MG tablet Commonly known as: PERCOCET/ROXICET Take 1 tablet by mouth every 6 (six) hours as needed for severe pain.   polyethylene glycol 17 g packet Commonly known as: MIRALAX / GLYCOLAX Take 17 g by mouth daily.   rosuvastatin 20 MG tablet Commonly known as: CRESTOR Take 20 mg by mouth every evening.   Semaglutide(0.25 or 0.5MG /DOS) 2 MG/3ML Sopn Inject 0.5 mg into the skin once a week.   tamsulosin 0.4 MG Caps capsule Commonly known as: FLOMAX Take 1 capsule (0.4 mg total) by mouth daily for 5 days.   temazepam 15 MG capsule Commonly known as: RESTORIL Take 15 mg by  mouth at bedtime as needed for sleep.   tiZANidine 4 MG tablet Commonly known as: ZANAFLEX Take 4 mg by mouth at bedtime.   Vitamin D (Ergocalciferol) 1.25 MG (50000 UNIT) Caps capsule Commonly known as: DRISDOL Take 50,000 Units by mouth every Wednesday.   vitamin E 1000 UNIT capsule Take 1,000 Units by mouth daily.       No Known Allergies    The results of significant diagnostics from this hospitalization (including imaging, microbiology, ancillary and laboratory) are listed below for reference.    Significant  Diagnostic Studies: DG Chest 2 View  Result Date: 03/22/2023 CLINICAL DATA:  Suspected Sepsis EXAM: CHEST - 2 VIEW COMPARISON:  None Available. FINDINGS: The heart and mediastinal contours are within normal limits. Low lung volumes. No focal consolidation. No pulmonary edema. No pleural effusion. No pneumothorax. No acute osseous abnormality. IMPRESSION: No active cardiopulmonary disease. Electronically Signed   By: Tish Frederickson M.D.   On: 03/22/2023 22:08   CT Renal Stone Study  Result Date: 03/22/2023 CLINICAL DATA:  Abdominal and flank pain with stone suspected. Right lower quadrant pain EXAM: CT ABDOMEN AND PELVIS WITHOUT CONTRAST TECHNIQUE: Multidetector CT imaging of the abdomen and pelvis was performed following the standard protocol without IV contrast. RADIATION DOSE REDUCTION: This exam was performed according to the departmental dose-optimization program which includes automated exposure control, adjustment of the mA and/or kV according to patient size and/or use of iterative reconstruction technique. COMPARISON:  02/02/2005 FINDINGS: Lower chest:  Coronary atherosclerosis.  No acute finding. Hepatobiliary: No focal liver abnormality.Cholecystectomy. No biliary dilatation. Pancreas: Unremarkable. Spleen: Generous size with 15 cm craniocaudal span. Adrenals/Urinary Tract: Negative adrenals. Perinephric stranding bilaterally, nonspecific in symmetric-likely baseline. Punctate bilateral calculi. No hydronephrosis or ureteral stone. Unremarkable bladder. Stomach/Bowel: No obstruction. No appendicitis. Numerous distal colonic diverticula. Vascular/Lymphatic: No acute vascular abnormality. Scattered atheromatous calcification. No mass or adenopathy. Reproductive:Symmetric enlargement of the prostate gland. Other: No ascites or pneumoperitoneum. Musculoskeletal: No acute abnormalities. IMPRESSION: 1. No acute finding.  No hydronephrosis or ureteral calculus. 2. Small bilateral renal calculi.  Atherosclerosis and colonic diverticulosis. Electronically Signed   By: Tiburcio Pea M.D.   On: 03/22/2023 08:20    Microbiology: Recent Results (from the past 240 hour(s))  Urine Culture (for pregnant, neutropenic or urologic patients or patients with an indwelling urinary catheter)     Status: Abnormal   Collection Time: 03/22/23  6:35 AM   Specimen: Urine, Clean Catch  Result Value Ref Range Status   Specimen Description URINE, CLEAN CATCH  Final   Special Requests   Final    NONE Performed at [facility], [address], [place], [place] [address]    Culture 90,000 COLONIES/mL KLEBSIELLA PNEUMONIAE (A)  Final   Report Status 03/24/2023 FINAL  Final   Organism ID, Bacteria KLEBSIELLA PNEUMONIAE (A)  Final      Susceptibility   Klebsiella pneumoniae - MIC*    AMPICILLIN >=32 RESISTANT Resistant     CEFAZOLIN <=4 SENSITIVE Sensitive     CEFEPIME <=0.12 SENSITIVE Sensitive     CEFTRIAXONE <=0.25 SENSITIVE Sensitive     CIPROFLOXACIN <=0.25 SENSITIVE Sensitive     GENTAMICIN <=1 SENSITIVE Sensitive     IMIPENEM <=0.25 SENSITIVE Sensitive     NITROFURANTOIN 64 INTERMEDIATE Intermediate     TRIMETH/SULFA <=20 SENSITIVE Sensitive     AMPICILLIN/SULBACTAM >=32 RESISTANT Resistant     PIP/TAZO 32 INTERMEDIATE Intermediate     * 90,000 COLONIES/mL KLEBSIELLA PNEUMONIAE  Blood culture (routine x  2)     Status: None (Preliminary result)   Collection Time: 03/22/23  6:57 AM   Specimen: BLOOD  Result Value Ref Range Status   Specimen Description BLOOD RIGHT ANTECUBITAL  Final   Special Requests   Final    BOTTLES DRAWN AEROBIC AND ANAEROBIC Blood Culture results may not be optimal due to an excessive volume of blood received in culture bottles   Culture   Final    NO GROWTH 3 DAYS Performed at [facility], [address] [place], [place] [address]    Report Status PENDING  Incomplete  Blood culture (routine x 2)     Status: None (Preliminary result)   Collection  Time: 03/22/23  7:08 AM   Specimen: BLOOD LEFT FOREARM  Result Value Ref Range Status   Specimen Description BLOOD LEFT FOREARM  Final   Special Requests   Final    BOTTLES DRAWN AEROBIC AND ANAEROBIC Blood Culture results may not be optimal due to an excessive volume of blood received in culture bottles   Culture   Final    NO GROWTH 3 DAYS Performed at [facility], [address] [place], [place] [address]    Report Status PENDING  Incomplete  Culture, blood (Routine x 2)     Status: None (Preliminary result)   Collection Time: 03/22/23  8:15 PM   Specimen: BLOOD RIGHT ARM  Result Value Ref Range Status   Specimen Description BLOOD RIGHT ARM  Final   Special Requests   Final    BOTTLES DRAWN AEROBIC AND ANAEROBIC Blood Culture adequate volume   Culture   Final    NO GROWTH 3 DAYS Performed at [facility], [address] [place], [place] [address]    Report Status PENDING  Incomplete  Culture, blood (Routine x 2)     Status: None (Preliminary result)   Collection Time: 03/22/23  8:31 PM   Specimen: BLOOD LEFT ARM  Result Value Ref Range Status   Specimen Description BLOOD LEFT ARM  Final   Special Requests   Final    BOTTLES DRAWN AEROBIC ONLY Blood Culture adequate volume   Culture   Final    NO GROWTH 3 DAYS Performed at [facility], [address], [place], [place] [address]    Report Status PENDING  Incomplete     Labs: Basic Metabolic Panel: Recent Labs  Lab 03/22/23 0626 03/22/23 1841 03/23/23 0229 03/23/23 0524 03/24/23 0108 03/25/23 0050  NA 136 136  --  135 133* 133*  K 4.1 4.0  --  3.7 4.1 3.7  CL 105 100  --  103 105 103  CO2 20* 19*  --  22 21* 21*  GLUCOSE 132* 161*  --  132* 102* 145*  BUN 23 23  --  21 18 19   CREATININE 1.80* 2.02*  --  1.78* 1.64* 1.50*  CALCIUM 9.3 8.9  --  7.9* 8.0* 8.3*  MG  --   --  1.1* 1.5*  --   --    Liver Function Tests: Recent Labs  Lab 03/22/23 0626 03/22/23 1841 03/23/23 0524  AST 26 22  17   ALT 25 21 17   ALKPHOS 39 39 29*  BILITOT 0.9 1.4* 1.7*  PROT 7.3 6.9 5.2*  ALBUMIN 4.2 3.8 2.7*   Recent Labs  Lab 03/22/23 0657 03/22/23 1841  LIPASE 61* 47   No results for input(s): "AMMONIA" in the last 168 hours. CBC: Recent Labs  Lab 03/22/23 0626 03/22/23 1841 03/23/23 0524 03/24/23 0108 03/25/23 0050  WBC 12.7* 12.9* 9.9 9.6 7.3  NEUTROABS  --   --   --  7.6 5.7  HGB 13.7 13.0 10.6* 10.7* 11.3*  HCT 41.2 38.7* 30.6* 32.4* 33.1*  MCV 89.4 89.2 90.0 91.3 87.6  PLT 137* 104* 75* 81* 105*   Cardiac Enzymes: No results for input(s): "CKTOTAL", "CKMB", "CKMBINDEX", "TROPONINI" in the last 168 hours. BNP: BNP (last 3 results) No results for input(s): "BNP" in the last 8760 hours.  ProBNP (last 3 results) No results for input(s): "PROBNP" in the last 8760 hours.  CBG: Recent Labs  Lab 03/24/23 0554 03/24/23 1121 03/24/23 1628 03/24/23 2110 03/25/23 0615  GLUCAP 101* 133* 82 142* 95       Signed:  Rhetta Mura MD   [facility] 03/25/2023, 12:20 PM

## 2023-03-25 NOTE — Telephone Encounter (Signed)
Post ED Visit - Positive Culture Follow-up  Culture report reviewed by antimicrobial stewardship pharmacist: [facility] [x]  [address] Pharm.D. []  Celedonio Miyamoto, [address].D., BCPS AQ-ID []  Garvin Fila, Pharm.D., BCPS []  Georgina Pillion, Pharm.D., BCPS []  [place], [address].D., BCPS, AAHIVP []  Estella Husk, Pharm.D., BCPS, AAHIVP []  Lysle Pearl, PharmD, BCPS []  Phillips Climes, PharmD, BCPS []  Agapito Games, PharmD, BCPS []  Verlan Friends, PharmD []  Mervyn Gay, PharmD, BCPS []  Vinnie Level, PharmD  [facility] []  Len Childs, PharmD []  Greer Pickerel, PharmD []  Adalberto Cole, PharmD []  Perlie Gold, Rph []  Lonell Face) Jean Rosenthal, PharmD []  Earl Many, PharmD []  Junita Push, PharmD []  Dorna Leitz, PharmD []  Terrilee Files, PharmD []  Lynann Beaver, PharmD []  Keturah Barre, PharmD []  Loralee Pacas, PharmD []  Bernadene Person, PharmD   Positive Urine culture Treated with Cephalexin, organism sensitive to the same and no further patient follow-up is required at this time.  Bing Quarry 03/25/2023, 6:31 PM

## 2023-03-26 LAB — CULTURE, BLOOD (ROUTINE X 2)
Culture: NO GROWTH
Special Requests: ADEQUATE

## 2023-03-27 LAB — CULTURE, BLOOD (ROUTINE X 2)
Culture: NO GROWTH
Culture: NO GROWTH
Special Requests: ADEQUATE

## 2023-10-02 ENCOUNTER — Other Ambulatory Visit (HOSPITAL_COMMUNITY): Payer: Self-pay

## 2023-10-02 MED ORDER — VITAMIN D (ERGOCALCIFEROL) 1.25 MG (50000 UNIT) PO CAPS
50000.0000 [IU] | ORAL_CAPSULE | ORAL | 0 refills | Status: DC
  Filled 2023-10-02: qty 13, 91d supply, fill #0

## 2023-10-02 MED ORDER — OZEMPIC (0.25 OR 0.5 MG/DOSE) 2 MG/3ML ~~LOC~~ SOPN
0.5000 mg | PEN_INJECTOR | SUBCUTANEOUS | 0 refills | Status: DC
  Filled 2023-10-02: qty 3, 28d supply, fill #0
  Filled 2023-10-24: qty 3, 28d supply, fill #1
  Filled 2023-11-21: qty 3, 28d supply, fill #2

## 2023-10-02 MED ORDER — LISINOPRIL-HYDROCHLOROTHIAZIDE 20-12.5 MG PO TABS
1.0000 | ORAL_TABLET | Freq: Every day | ORAL | 0 refills | Status: DC
  Filled 2023-10-02: qty 90, 90d supply, fill #0

## 2023-10-02 MED ORDER — ROSUVASTATIN CALCIUM 20 MG PO TABS
20.0000 mg | ORAL_TABLET | Freq: Every day | ORAL | 0 refills | Status: DC
  Filled 2023-10-02: qty 100, 100d supply, fill #0

## 2023-10-02 MED ORDER — GABAPENTIN 300 MG PO CAPS
300.0000 mg | ORAL_CAPSULE | Freq: Three times a day (TID) | ORAL | 0 refills | Status: DC
  Filled 2023-10-02: qty 300, 100d supply, fill #0

## 2023-10-03 ENCOUNTER — Other Ambulatory Visit (HOSPITAL_COMMUNITY): Payer: Self-pay

## 2023-10-30 ENCOUNTER — Other Ambulatory Visit (HOSPITAL_COMMUNITY): Payer: Self-pay

## 2023-10-30 ENCOUNTER — Other Ambulatory Visit: Payer: Self-pay

## 2023-10-30 MED ORDER — LEVETIRACETAM 1000 MG PO TABS
1000.0000 mg | ORAL_TABLET | Freq: Every evening | ORAL | 12 refills | Status: AC
  Filled 2023-10-30: qty 30, 30d supply, fill #0
  Filled 2023-11-24: qty 30, 30d supply, fill #1
  Filled 2023-12-23: qty 30, 30d supply, fill #2

## 2023-10-30 MED ORDER — TEMAZEPAM 15 MG PO CAPS
15.0000 mg | ORAL_CAPSULE | Freq: Every evening | ORAL | 1 refills | Status: DC | PRN
  Filled 2023-11-14 (×3): qty 90, 90d supply, fill #0
  Filled 2024-01-28 – 2024-02-19 (×2): qty 90, 90d supply, fill #1

## 2023-11-14 ENCOUNTER — Other Ambulatory Visit (HOSPITAL_COMMUNITY): Payer: Self-pay

## 2023-11-14 ENCOUNTER — Other Ambulatory Visit: Payer: Self-pay

## 2023-11-20 ENCOUNTER — Other Ambulatory Visit (HOSPITAL_COMMUNITY): Payer: Self-pay

## 2023-11-25 ENCOUNTER — Other Ambulatory Visit (HOSPITAL_COMMUNITY): Payer: Self-pay

## 2023-12-02 ENCOUNTER — Other Ambulatory Visit (HOSPITAL_COMMUNITY): Payer: Self-pay

## 2023-12-02 MED ORDER — TRAZODONE HCL 100 MG PO TABS
100.0000 mg | ORAL_TABLET | Freq: Every day | ORAL | 4 refills | Status: DC
  Filled 2023-12-02: qty 30, 30d supply, fill #0
  Filled 2023-12-25: qty 30, 30d supply, fill #1
  Filled 2024-01-24: qty 30, 30d supply, fill #2

## 2023-12-10 ENCOUNTER — Other Ambulatory Visit (HOSPITAL_COMMUNITY): Payer: Self-pay

## 2023-12-10 ENCOUNTER — Other Ambulatory Visit (HOSPITAL_BASED_OUTPATIENT_CLINIC_OR_DEPARTMENT_OTHER): Payer: Self-pay

## 2023-12-10 MED ORDER — ROSUVASTATIN CALCIUM 20 MG PO TABS
20.0000 mg | ORAL_TABLET | Freq: Every day | ORAL | 0 refills | Status: DC
  Filled 2023-12-10 – 2024-01-05 (×2): qty 100, 100d supply, fill #0

## 2023-12-10 MED ORDER — GABAPENTIN 300 MG PO CAPS
300.0000 mg | ORAL_CAPSULE | Freq: Three times a day (TID) | ORAL | 0 refills | Status: DC
  Filled 2023-12-10 – 2024-01-05 (×2): qty 300, 100d supply, fill #0

## 2023-12-10 MED ORDER — LISINOPRIL-HYDROCHLOROTHIAZIDE 20-12.5 MG PO TABS
1.0000 | ORAL_TABLET | Freq: Every day | ORAL | 0 refills | Status: AC
  Filled 2023-12-10 – 2023-12-25 (×2): qty 90, 90d supply, fill #0

## 2023-12-10 MED ORDER — OZEMPIC (1 MG/DOSE) 4 MG/3ML ~~LOC~~ SOPN
1.0000 mg | PEN_INJECTOR | SUBCUTANEOUS | 3 refills | Status: DC
  Filled 2023-12-10 – 2023-12-11 (×2): qty 3, 28d supply, fill #0
  Filled 2024-01-01: qty 3, 28d supply, fill #1
  Filled 2024-01-29: qty 3, 28d supply, fill #2
  Filled 2024-02-26: qty 3, 28d supply, fill #3

## 2023-12-10 MED ORDER — OZEMPIC (0.25 OR 0.5 MG/DOSE) 2 MG/3ML ~~LOC~~ SOPN
0.5000 mg | PEN_INJECTOR | SUBCUTANEOUS | 0 refills | Status: DC
  Filled 2023-12-10: qty 9, 84d supply, fill #0
  Filled 2023-12-19: qty 3, 28d supply, fill #0
  Filled 2024-01-13: qty 3, 28d supply, fill #1
  Filled 2024-02-10: qty 3, 28d supply, fill #2

## 2023-12-10 MED ORDER — ACCU-CHEK GUIDE TEST VI STRP
ORAL_STRIP | 3 refills | Status: AC
  Filled 2023-12-10 – 2023-12-22 (×3): qty 300, 100d supply, fill #0
  Filled 2024-03-25: qty 300, 100d supply, fill #1

## 2023-12-10 MED ORDER — VITAMIN D (ERGOCALCIFEROL) 1.25 MG (50000 UNIT) PO CAPS
50000.0000 [IU] | ORAL_CAPSULE | ORAL | 0 refills | Status: DC
  Filled 2023-12-10 – 2023-12-26 (×2): qty 13, 91d supply, fill #0

## 2023-12-10 MED ORDER — LEVETIRACETAM 1000 MG PO TABS
1000.0000 mg | ORAL_TABLET | Freq: Every evening | ORAL | 0 refills | Status: DC
  Filled 2023-12-10: qty 100, 100d supply, fill #0

## 2023-12-10 MED ORDER — ONETOUCH DELICA PLUS LANCET33G MISC
3 refills | Status: AC
  Filled 2023-12-10 – 2023-12-13 (×2): qty 306, 102d supply, fill #0
  Filled 2023-12-23 – 2024-03-18 (×2): qty 300, 100d supply, fill #0
  Filled 2024-06-26: qty 300, 100d supply, fill #1

## 2023-12-11 ENCOUNTER — Other Ambulatory Visit (HOSPITAL_COMMUNITY): Payer: Self-pay

## 2023-12-11 ENCOUNTER — Other Ambulatory Visit: Payer: Self-pay

## 2023-12-11 ENCOUNTER — Other Ambulatory Visit (HOSPITAL_BASED_OUTPATIENT_CLINIC_OR_DEPARTMENT_OTHER): Payer: Self-pay

## 2023-12-12 ENCOUNTER — Other Ambulatory Visit: Payer: Self-pay

## 2023-12-12 ENCOUNTER — Other Ambulatory Visit (HOSPITAL_COMMUNITY): Payer: Self-pay

## 2023-12-13 ENCOUNTER — Other Ambulatory Visit (HOSPITAL_COMMUNITY): Payer: Self-pay

## 2023-12-15 ENCOUNTER — Other Ambulatory Visit: Payer: Self-pay

## 2023-12-15 ENCOUNTER — Other Ambulatory Visit (HOSPITAL_COMMUNITY): Payer: Self-pay

## 2023-12-15 MED ORDER — ONETOUCH VERIO W/DEVICE KIT
PACK | 0 refills | Status: DC
  Filled 2023-12-16: qty 1, 30d supply, fill #0

## 2023-12-15 MED ORDER — ONETOUCH VERIO VI STRP
ORAL_STRIP | 3 refills | Status: DC
  Filled 2023-12-22: qty 300, 100d supply, fill #0

## 2023-12-15 MED ORDER — ONETOUCH DELICA PLUS LANCET33G MISC
3 refills | Status: DC
  Filled 2023-12-16: qty 300, 100d supply, fill #0

## 2023-12-16 ENCOUNTER — Other Ambulatory Visit (HOSPITAL_COMMUNITY): Payer: Self-pay

## 2023-12-16 ENCOUNTER — Other Ambulatory Visit: Payer: Self-pay

## 2023-12-19 ENCOUNTER — Other Ambulatory Visit: Payer: Self-pay

## 2023-12-19 ENCOUNTER — Other Ambulatory Visit (HOSPITAL_COMMUNITY): Payer: Self-pay

## 2023-12-22 ENCOUNTER — Other Ambulatory Visit (HOSPITAL_COMMUNITY): Payer: Self-pay

## 2023-12-22 ENCOUNTER — Other Ambulatory Visit: Payer: Self-pay

## 2023-12-23 ENCOUNTER — Other Ambulatory Visit (HOSPITAL_COMMUNITY): Payer: Self-pay

## 2023-12-23 ENCOUNTER — Other Ambulatory Visit: Payer: Self-pay

## 2023-12-24 ENCOUNTER — Other Ambulatory Visit (HOSPITAL_COMMUNITY): Payer: Self-pay

## 2023-12-25 ENCOUNTER — Other Ambulatory Visit (HOSPITAL_COMMUNITY): Payer: Self-pay

## 2023-12-25 ENCOUNTER — Other Ambulatory Visit: Payer: Self-pay

## 2023-12-26 ENCOUNTER — Other Ambulatory Visit: Payer: Self-pay

## 2023-12-26 ENCOUNTER — Other Ambulatory Visit (HOSPITAL_COMMUNITY): Payer: Self-pay

## 2024-01-01 ENCOUNTER — Other Ambulatory Visit (HOSPITAL_COMMUNITY): Payer: Self-pay

## 2024-01-05 ENCOUNTER — Other Ambulatory Visit (HOSPITAL_COMMUNITY): Payer: Self-pay

## 2024-01-28 ENCOUNTER — Other Ambulatory Visit: Payer: Self-pay

## 2024-01-28 ENCOUNTER — Other Ambulatory Visit (HOSPITAL_COMMUNITY): Payer: Self-pay

## 2024-02-13 ENCOUNTER — Emergency Department (EMERGENCY_DEPARTMENT_HOSPITAL)
Admission: EM | Admit: 2024-02-13 | Discharge: 2024-02-13 | Disposition: A | Discharge status: Home / Self Care | Source: Home / Self Care | Attending: Emergency Medicine | Admitting: Emergency Medicine

## 2024-02-13 ENCOUNTER — Emergency Department (HOSPITAL_COMMUNITY)

## 2024-02-13 ENCOUNTER — Other Ambulatory Visit: Payer: Self-pay

## 2024-02-13 DIAGNOSIS — R531 Weakness: Secondary | ICD-10-CM | POA: Insufficient documentation

## 2024-02-13 DIAGNOSIS — Z8673 Personal history of transient ischemic attack (TIA), and cerebral infarction without residual deficits: Secondary | ICD-10-CM | POA: Diagnosis not present

## 2024-02-13 DIAGNOSIS — I6389 Other cerebral infarction: Secondary | ICD-10-CM | POA: Diagnosis not present

## 2024-02-13 DIAGNOSIS — Z7982 Long term (current) use of aspirin: Secondary | ICD-10-CM | POA: Diagnosis not present

## 2024-02-13 DIAGNOSIS — R202 Paresthesia of skin: Secondary | ICD-10-CM | POA: Diagnosis not present

## 2024-02-13 LAB — CBC
HCT: 40.8 % (ref 39.0–52.0)
Hemoglobin: 13.9 g/dL (ref 13.0–17.0)
MCH: 31.4 pg (ref 26.0–34.0)
MCHC: 34.1 g/dL (ref 30.0–36.0)
MCV: 92.3 fL (ref 80.0–100.0)
Platelets: 135 10*3/uL — ABNORMAL LOW (ref 150–400)
RBC: 4.42 MIL/uL (ref 4.22–5.81)
RDW: 11.7 % (ref 11.5–15.5)
WBC: 7.8 10*3/uL (ref 4.0–10.5)
nRBC: 0 % (ref 0.0–0.2)

## 2024-02-13 LAB — I-STAT CHEM 8, ED
BUN: 34 mg/dL — ABNORMAL HIGH (ref 8–23)
Calcium, Ion: 1.21 mmol/L (ref 1.15–1.40)
Chloride: 106 mmol/L (ref 98–111)
Creatinine, Ser: 2.2 mg/dL — ABNORMAL HIGH (ref 0.61–1.24)
Glucose, Bld: 186 mg/dL — ABNORMAL HIGH (ref 70–99)
HCT: 41 % (ref 39.0–52.0)
Hemoglobin: 13.9 g/dL (ref 13.0–17.0)
Potassium: 4.7 mmol/L (ref 3.5–5.1)
Sodium: 139 mmol/L (ref 135–145)
TCO2: 21 mmol/L — ABNORMAL LOW (ref 22–32)

## 2024-02-13 LAB — DIFFERENTIAL
Abs Immature Granulocytes: 0.03 10*3/uL (ref 0.00–0.07)
Basophils Absolute: 0 10*3/uL (ref 0.0–0.1)
Basophils Relative: 1 %
Eosinophils Absolute: 0.1 10*3/uL (ref 0.0–0.5)
Eosinophils Relative: 1 %
Immature Granulocytes: 0 %
Lymphocytes Relative: 17 %
Lymphs Abs: 1.3 10*3/uL (ref 0.7–4.0)
Monocytes Absolute: 0.5 10*3/uL (ref 0.1–1.0)
Monocytes Relative: 6 %
Neutro Abs: 5.9 10*3/uL (ref 1.7–7.7)
Neutrophils Relative %: 75 %

## 2024-02-13 LAB — ETHANOL: Alcohol, Ethyl (B): <15 mg/dL (ref <15)

## 2024-02-13 LAB — COMPREHENSIVE METABOLIC PANEL WITH GFR
ALT: 29 U/L (ref 0–44)
AST: 27 U/L (ref 15–41)
Albumin: 3.9 g/dL (ref 3.5–5.0)
Alkaline Phosphatase: 37 U/L — ABNORMAL LOW (ref 38–126)
Anion gap: 11 (ref 5–15)
BUN: 31 mg/dL — ABNORMAL HIGH (ref 8–23)
CO2: 23 mmol/L (ref 22–32)
Calcium: 9.6 mg/dL (ref 8.9–10.3)
Chloride: 105 mmol/L (ref 98–111)
Creatinine, Ser: 2.16 mg/dL — ABNORMAL HIGH (ref 0.61–1.24)
GFR, Estimated: 33 mL/min — ABNORMAL LOW (ref >60)
Glucose, Bld: 183 mg/dL — ABNORMAL HIGH (ref 70–99)
Potassium: 4.7 mmol/L (ref 3.5–5.1)
Sodium: 139 mmol/L (ref 135–145)
Total Bilirubin: 0.7 mg/dL (ref 0.0–1.2)
Total Protein: 6.6 g/dL (ref 6.5–8.1)

## 2024-02-13 LAB — APTT: aPTT: 30 s (ref 24–36)

## 2024-02-13 LAB — CBG MONITORING, ED: Glucose-Capillary: 193 mg/dL — ABNORMAL HIGH (ref 70–99)

## 2024-02-13 LAB — PROTIME-INR
INR: 1.1 (ref 0.8–1.2)
Prothrombin Time: 14.2 s (ref 11.4–15.2)

## 2024-02-13 MED ORDER — CLOPIDOGREL BISULFATE 75 MG PO TABS: 75.0000 mg | ORAL_TABLET | Freq: Every day | ORAL | 0 refills | Status: DC

## 2024-02-13 MED ORDER — SODIUM CHLORIDE 0.9% FLUSH: 3.0000 mL | Freq: Once | INTRAVENOUS | Status: DC

## 2024-02-13 MED ORDER — CLOPIDOGREL BISULFATE 300 MG PO TABS
300.0000 mg | ORAL_TABLET | Freq: Once | ORAL | Status: AC
  Administered 2024-02-13: 300 mg via ORAL
  Filled 2024-02-13: qty 1

## 2024-02-13 MED ORDER — GADOBUTROL 1 MMOL/ML IV SOLN
8.0000 mL | Freq: Once | INTRAVENOUS | Status: AC | PRN
Start: 2024-02-13 — End: 2024-02-13
  Administered 2024-02-13: 8 mL via INTRAVENOUS

## 2024-02-13 NOTE — ED Notes (Signed)
 Pt returned from MRI, placed back on monitoring cords, given diet sprite per request.

## 2024-02-13 NOTE — ED Notes (Signed)
 Pt back from MRI

## 2024-02-13 NOTE — ED Provider Triage Note (Signed)
 Emergency Medicine Provider Triage Evaluation Note  Gordon Christian , a 63 y.o. male  was evaluated in triage.  Pt complains of left arm weakness, left leg weakness and facial weakness.  Pt reports symptoms began at 8:30 am.  Pt normal when he got up at 7:00am   Review of Systems  Positive: weakness Negative: Visual change, speech difficulty or neglect   Physical Exam  BP 96/67 (BP Location: Right Arm)   Pulse 77   Temp 97.6 F (36.4 C)   Resp 16   Ht 5\' 8"  (1.727 m)   Wt 77.1 kg   SpO2 96%   BMI 25.85 kg/m  Gen:   Awake, no distress,  Resp:  Normal effort  MSK:   Decreased grip left hand, weakn left leg Other:    Medical Decision Making  Medically screening exam initiated at 3:32 PM.  Appropriate orders placed.  Linsey Lombardi was informed that the remainder of the evaluation will be completed by another provider, this initial triage assessment does not replace that evaluation, and the importance of remaining in the ED until their evaluation is complete.  Stroke orders placed,     Sandi Crosby, PA-C 02/13/24 1538

## 2024-02-13 NOTE — ED Notes (Signed)
Pt up to bathroom, ambulatory with steady gait.

## 2024-02-13 NOTE — ED Notes (Signed)
 Patient transported to MRI

## 2024-02-13 NOTE — Discharge Instructions (Addendum)
 MRI MRAs were negative no evidence of any acute infarct.  Based on discussion with neurology okay for discharge home.  Return for any new or worse symptoms.  Make an appointment to follow-up with your doctor.  You were given a dose of Plavix here 300 mg.  Neurology wants you to continue with Plavix 75 mg a day for the next 3 weeks.  Prescription sent to the [facility] in [place]

## 2024-02-13 NOTE — Consult Note (Signed)
 NEUROLOGY CONSULT NOTE   Date of service: Feb 13, 2024 Patient Name: Gordon Christian MRN:  [id number] DOB:  12-05-1960 Chief Complaint: "Left-sided tingling" Requesting Provider: Nicklas Barns, MD  History of Present Illness  Gordon Christian is a 63 y.o. male with hx of previous stroke in 2012 with left-sided weakness and numbness who presents with transient episode of left-sided tingling and numbness earlier today.  He states that it started in his left leg and his entire left leg felt numb and tingly.  About 45 minutes later, his arm became tingly as well, and this all lasted approximately 1 hour.  He denies any headache associated with it, but did notice significant photophobia during the episode.  He is currently completely resolved.   Past History   Past Medical History:  Diagnosis Date   Arthritis    Diabetes mellitus without complication (HCC)    Essential hypertension 03/23/2023   History of kidney stones    Hypercholesteremia    Stroke [facility])     Past Surgical History:  Procedure Laterality Date   APPENDECTOMY     CHOLECYSTECTOMY     CYSTOSCOPY W/ URETERAL STENT PLACEMENT     KNEE ARTHROSCOPY Right    SHOULDER ARTHROSCOPY Right 1999   TOTAL KNEE ARTHROPLASTY Right 09/04/2022   Procedure: TOTAL KNEE ARTHROPLASTY;  Surgeon: Orvan Blanch, MD;  Location: [facility];  Service: Orthopedics;  Laterality: Right;    Family History: Family History  Problem Relation Age of Onset   Hypertension Mother    Hypertension Father     Social History  reports that he has never smoked. He has never used smokeless tobacco. He reports current alcohol  use. He reports that he does not use drugs.  No Known Allergies  Medications   Current Facility-Administered Medications:    clopidogrel (PLAVIX) tablet 300 mg, 300 mg, Oral, Once, Zackowski, Scott, MD  Current Outpatient Medications:    clopidogrel (PLAVIX) 75 MG tablet, Take 1 tablet (75 mg total) by mouth daily., Disp: 21 tablet, Rfl: 0    Lancets (ONETOUCH DELICA PLUS LANCET33G) MISC, Use to check blood sugar three times daily, Disp: 300 each, Rfl: 3   acetaminophen  (TYLENOL ) 650 MG CR tablet, Take 1,300 mg by mouth every 8 (eight) hours as needed for pain., Disp: , Rfl:    aspirin  325 MG tablet, Take 325 mg by mouth 2 (two) times daily., Disp: , Rfl:    Blood Glucose Monitoring Suppl (ONETOUCH VERIO) w/Device KIT, Use as directed by physician to monitor blood sugars, Disp: 1 kit, Rfl: 0   Carboxymethylcellul-Glycerin (LUBRICATING EYE DROPS OP), Place 1 drop into both eyes daily as needed (dry eyes)., Disp: , Rfl:    docusate sodium  (COLACE) 100 MG capsule, Take 1 capsule (100 mg total) by mouth 2 (two) times daily as needed for mild constipation., Disp: 30 capsule, Rfl: 1   gabapentin  (NEURONTIN ) 300 MG capsule, Take 300 mg by mouth 2 (two) times daily., Disp: , Rfl:    gabapentin  (NEURONTIN ) 300 MG capsule, Take 1 capsule (300 mg total) by mouth 3 (three) times daily., Disp: 300 capsule, Rfl: 0   glucose blood (ACCU-CHEK GUIDE TEST) test strip, USE TO CHECK BLOOD SUGAR 3 TIMES DAILY, Disp: 300 each, Rfl: 3   glucose blood (ONETOUCH VERIO) test strip, USE TO CHECK BLOOD SUGAR 3 TIMES DAILY, Disp: 300 each, Rfl: 3   Lancets (ONETOUCH DELICA PLUS LANCET33G) MISC, Use to check blood sugars three times daily., Disp: 300 each, Rfl: 3   levETIRAcetam  (KEPPRA )  1000 MG tablet, Take 1,000 mg by mouth at bedtime., Disp: , Rfl:    levETIRAcetam  (KEPPRA ) 1000 MG tablet, Take 1 tablet (1,000 mg total) by mouth every evening., Disp: 30 tablet, Rfl: 12   levETIRAcetam  (KEPPRA ) 1000 MG tablet, Take 1 tablet (1,000 mg total) by mouth at bedtime., Disp: 100 tablet, Rfl: 0   lisinopril -hydrochlorothiazide  (ZESTORETIC ) 20-12.5 MG tablet, Take 1 tablet by mouth daily., Disp: 90 tablet, Rfl: 0   metFORMIN (GLUCOPHAGE) 500 MG tablet, Take 500 mg by mouth 2 (two) times daily., Disp: , Rfl:    ondansetron  (ZOFRAN -ODT) 4 MG disintegrating tablet, Take 1  tablet (4 mg total) by mouth every 8 (eight) hours as needed for nausea or vomiting., Disp: 20 tablet, Rfl: 0   oxyCODONE -acetaminophen  (PERCOCET/ROXICET) 5-325 MG tablet, Take 1 tablet by mouth every 6 (six) hours as needed for severe pain., Disp: 5 tablet, Rfl: 0   polyethylene glycol (MIRALAX  / GLYCOLAX ) 17 g packet, Take 17 g by mouth daily. (Patient not taking: Reported on 03/22/2023), Disp: 14 each, Rfl: 0   rosuvastatin  (CRESTOR ) 20 MG tablet, Take 20 mg by mouth every evening., Disp: , Rfl:    rosuvastatin  (CRESTOR ) 20 MG tablet, Take 1 tablet (20 mg total) by mouth at bedtime., Disp: 100 tablet, Rfl: 0   Semaglutide , 1 MG/DOSE, (OZEMPIC , 1 MG/DOSE,) 4 MG/3ML SOPN, Inject 1 mg into the skin once a week., Disp: 3 mL, Rfl: 3   Semaglutide ,0.25 or 0.5MG /DOS, (OZEMPIC , 0.25 OR 0.5 MG/DOSE,) 2 MG/3ML SOPN, Inject 0.5 mg into the skin once a week., Disp: 9 mL, Rfl: 0   Semaglutide ,0.25 or 0.5MG /DOS, 2 MG/3ML SOPN, Inject 0.5 mg into the skin once a week., Disp: , Rfl:    temazepam  (RESTORIL ) 15 MG capsule, Take 15 mg by mouth at bedtime as needed for sleep., Disp: , Rfl:    temazepam  (RESTORIL ) 15 MG capsule, Take 1 capsule (15 mg total) by mouth at bedtime as needed for sleep., Disp: 90 capsule, Rfl: 1   tiZANidine  (ZANAFLEX ) 4 MG tablet, Take 4 mg by mouth at bedtime., Disp: , Rfl:    traZODone  (DESYREL ) 100 MG tablet, Take 1 tablet (100 mg total) by mouth at bedtime., Disp: 30 tablet, Rfl: 4   Vitamin D , Ergocalciferol , (DRISDOL ) 1.25 MG (50000 UNIT) CAPS capsule, Take 50,000 Units by mouth every Wednesday., Disp: , Rfl:    Vitamin D , Ergocalciferol , (DRISDOL ) 1.25 MG (50000 UNIT) CAPS capsule, Take 1 capsule (50,000 Units total) by mouth once a week., Disp: 13 capsule, Rfl: 0   vitamin E 1000 UNIT capsule, Take 1,000 Units by mouth daily., Disp: , Rfl:   Vitals   Vitals:   02/13/24 1930 02/13/24 2000 02/13/24 2100 02/13/24 2130  BP: (!) 147/75 138/84 136/77 130/79  Pulse: 69 70 70 66   Resp: 15 15  13   Temp:    97.7 F (36.5 C)  SpO2: 99% 99% 98% 100%  Weight:      Height:        Body mass index is 25.85 kg/m.  Physical Exam   Constitutional: Appears well-developed and well-nourished.   Neurologic Examination    Neuro: Mental Status: Patient is awake, alert, oriented to person, place, month, year, and situation. Patient is able to give a clear and coherent history. Cranial Nerves: II: Visual Fields are full.  III,IV, VI: EOMI without ptosis or diploplia.  V: Facial sensation is symmetric to temperature VII: Facial movement is symmetric.  Motor: Moves all extremities well  sensory: Sensation  is symmetric to light touch and temperature in the arms and legs. Cerebellar: No clear ataxia   Labs/Imaging/Neurodiagnostic studies   CBC:  Recent Labs  Lab 03/01/24 1510 03-01-2024 1523  WBC 7.8  --   NEUTROABS 5.9  --   HGB 13.9 13.9  HCT 40.8 41.0  MCV 92.3  --   PLT 135*  --    Basic Metabolic Panel:  Lab Results  Component Value Date   NA 139 03/01/2024   K 4.7 03/01/24   CO2 23 2024/03/01   GLUCOSE 186 (H) Mar 01, 2024   BUN 34 (H) March 01, 2024   CREATININE 2.20 (H) 03-01-2024   CALCIUM  9.6 03-01-2024   GFRNONAA 33 (L) 2024/03/01   Lipid Panel: No results found for: "LDLCALC" HgbA1c:  Lab Results  Component Value Date   HGBA1C 6.7 (H) 03/23/2023    Alcohol  Level     Component Value Date/Time   [facility] <15 03/01/24 1510   INR  Lab Results  Component Value Date   INR 1.1 03-01-2024   APTT  Lab Results  Component Value Date   APTT 30 Mar 01, 2024     CT Head without contrast(Personally reviewed): Negative  MRI Brain(Personally reviewed): Negative   ASSESSMENT   Gordon Christian is a 63 y.o. male with a history of pontine infarct who is already fairly aggressively managed from small vessel respiratory standpoint on high intensity statin, antiplatelet therapy with aspirin  who presented with progressive paresthesia of the left  side lasting for several hours associate with photophobia.  This description is very unusual for stroke.  If it is due to ischemia, the most likely location would be in the thalamus associated with small vessel disease.  He is already fairly aggressively managed for small vessel risk factors, but would continue focusing on this for secondary prevention.  I did discuss with him that I felt that the chance for cardioembolic stroke was very small, but I could not say there was a zero risk and discussed options with him including admission for expedition of telemetry/echocardiogram versus follow-up as an outpatient.  He favored following up as an outpatient and I think this is reasonable given my low suspicion for cardioembolic source.  The description sounds much more typical of complicated migraine, but given his history I think it would be reasonable to treat with a short course of dual antiplatelet therapy given that I feel this is a relatively low risk intervention.  RECOMMENDATIONS  Continue home aspirin  81 mg daily Add Plavix, 75 mg daily after 300 mg load for 3 weeks Continue high intensity statin, goal LDL is less than 70 Continue DM control, goal A1c is less than seven Continue hypertension control with a goal of normotension Could consider outpatient Holter monitor and echocardiogram, though my suspicion is relatively low for cardioembolic source. Follow-up with outpatient PCP. ______________________________________________________________________    Signed, Ann Keto, MD [facility]

## 2024-02-13 NOTE — ED Provider Notes (Addendum)
 [place] [facility]MENT AT [facility] Provider Note   CSN: [id number] Arrival date & time: 02/13/24  1355     History  Chief Complaint  Patient presents with   Numbness    Weakness      Gordon Christian is a 63 y.o. male.  Patient with a history of previous stroke affecting his left side.  Still has a little bit of coordination problems on that side.  Patient awoke at 730 erythematous fine at 830 started to get left-sided numbness tingling and weakness also involves some of his face.  At some point while he was here all that resolved.  Things went sort of progressed gradually and went away gradually as well.  Temp here 97.6 heart rate 77 respiration 16 blood pressure 119/82 oxygen sats 100% on room air.  Patient is on the medication Keppra  patient is on Neurontin  as well.  Patient currently asymptomatic.       Home Medications Prior to Admission medications   Medication Sig Start Date End Date Taking? Authorizing Provider  clopidogrel (PLAVIX) 75 MG tablet Take 1 tablet (75 mg total) by mouth daily. 02/13/24  Yes Fremon Zacharia, MD  Lancets [facility] DELICA PLUS [place]) MISC Use to check blood sugar three times daily 12/10/23     acetaminophen  (TYLENOL ) 650 MG CR tablet Take 1,300 mg by mouth every 8 (eight) hours as needed for pain.    [provider]  aspirin  325 MG tablet Take 325 mg by mouth 2 (two) times daily.    [provider]  Blood Glucose Monitoring Suppl (ONETOUCH VERIO) w/Device KIT Use as directed by physician to monitor blood sugars 12/12/23     Carboxymethylcellul-Glycerin (LUBRICATING EYE DROPS OP) Place 1 drop into both eyes daily as needed (dry eyes).    [provider]  docusate sodium  (COLACE) 100 MG capsule Take 1 capsule (100 mg total) by mouth 2 (two) times daily as needed for mild constipation. 09/04/22   Orvan Blanch, MD  gabapentin  (NEURONTIN ) 300 MG capsule Take 300 mg by mouth 2 (two) times daily.    [provider]  gabapentin  (NEURONTIN ) 300 MG capsule Take 1 capsule (300 mg total) by mouth 3 (three) times daily. 12/10/23     glucose blood (ACCU-CHEK GUIDE TEST) test strip USE TO CHECK BLOOD SUGAR 3 TIMES DAILY 12/10/23     glucose blood (ONETOUCH VERIO) test strip USE TO CHECK BLOOD SUGAR 3 TIMES DAILY 12/12/23     Lancets (ONETOUCH DELICA PLUS LANCET33G) MISC Use to check blood sugars three times daily. 12/12/23     levETIRAcetam  (KEPPRA ) 1000 MG tablet Take 1,000 mg by mouth at bedtime.    [provider]  levETIRAcetam  (KEPPRA ) 1000 MG tablet Take 1 tablet (1,000 mg total) by mouth every evening. 10/30/23     levETIRAcetam  (KEPPRA ) 1000 MG tablet Take 1 tablet (1,000 mg total) by mouth at bedtime. 12/10/23     lisinopril -hydrochlorothiazide  (ZESTORETIC ) 20-12.5 MG tablet Take 1 tablet by mouth daily. 12/10/23     metFORMIN (GLUCOPHAGE) 500 MG tablet Take 500 mg by mouth 2 (two) times daily.    [provider]  ondansetron  (ZOFRAN -ODT) 4 MG disintegrating tablet Take 1 tablet (4 mg total) by mouth every 8 (eight) hours as needed for nausea or vomiting. 03/22/23   Henderly, Britni A, PA-C  oxyCODONE -acetaminophen  (PERCOCET/ROXICET) 5-325 MG tablet Take 1 tablet by mouth every 6 (six) hours as needed for severe pain. 03/22/23   Henderly, Britni A, PA-C  polyethylene glycol (MIRALAX  / GLYCOLAX ) 17 g packet Take 17 g by mouth daily. Patient not taking: Reported on 03/22/2023 09/04/22   Orvan Blanch, MD  rosuvastatin  (CRESTOR ) 20 MG tablet Take 20 mg by mouth every evening.    [provider]  rosuvastatin  (CRESTOR ) 20 MG tablet Take 1 tablet (20 mg total) by mouth at bedtime. 12/10/23     Semaglutide , 1 MG/DOSE, (OZEMPIC , 1 MG/DOSE,) 4 MG/3ML SOPN Inject 1 mg into the skin once a week. 12/10/23     Semaglutide ,0.25 or 0.5MG /DOS, (OZEMPIC , 0.25 OR 0.5 MG/DOSE,) 2 MG/3ML SOPN Inject 0.5 mg into the skin once a week. 12/10/23     Semaglutide ,0.25 or 0.5MG /DOS, 2 MG/3ML SOPN Inject  0.5 mg into the skin once a week. 02/06/23   [provider]  temazepam  (RESTORIL ) 15 MG capsule Take 15 mg by mouth at bedtime as needed for sleep.    [provider]  temazepam  (RESTORIL ) 15 MG capsule Take 1 capsule (15 mg total) by mouth at bedtime as needed for sleep. 10/30/23     tiZANidine  (ZANAFLEX ) 4 MG tablet Take 4 mg by mouth at bedtime.    [provider]  traZODone  (DESYREL ) 100 MG tablet Take 1 tablet (100 mg total) by mouth at bedtime. 12/02/23     Vitamin D , Ergocalciferol , (DRISDOL ) 1.25 MG (50000 UNIT) CAPS capsule Take 50,000 Units by mouth every Wednesday.    [provider]  Vitamin D , Ergocalciferol , (DRISDOL ) 1.25 MG (50000 UNIT) CAPS capsule Take 1 capsule (50,000 Units total) by mouth once a week. 12/10/23     vitamin E 1000 UNIT capsule Take 1,000 Units by mouth daily.    [provider]      Allergies    Patient has no known allergies.    Review of Systems   Review of Systems  Constitutional:  Negative for chills and fever.  HENT:  Negative for ear pain and sore throat.   Eyes:  Negative for pain and visual disturbance.  Respiratory:  Negative for cough and shortness of breath.   Cardiovascular:  Negative for chest pain and palpitations.  Gastrointestinal:  Negative for abdominal pain and vomiting.  Genitourinary:  Negative for dysuria and hematuria.  Musculoskeletal:  Negative for arthralgias and back pain.  Skin:  Negative for color change and rash.  Neurological:  Positive for facial asymmetry, weakness and numbness. Negative for seizures and syncope.  All other systems reviewed and are negative.   Physical Exam Updated Vital Signs BP 130/79   Pulse 66   Temp 97.7 F (36.5 C)   Resp 13   Ht 1.727 m (5\' 8" )   Wt 77.1 kg   SpO2 100%   BMI 25.85 kg/m  Physical Exam Vitals and nursing note reviewed.  Constitutional:      General: He is not in acute distress.    Appearance: Normal appearance. He is  well-developed.  HENT:     Head: Normocephalic and atraumatic.  Eyes:     Extraocular Movements: Extraocular movements intact.     Conjunctiva/sclera: Conjunctivae normal.     Pupils: Pupils are equal, round, and reactive to light.  Cardiovascular:     Rate and Rhythm: Normal rate and regular rhythm.     Heart sounds: No murmur heard. Pulmonary:     Effort: Pulmonary effort is normal. No respiratory distress.     Breath sounds: Normal breath sounds.  Abdominal:     Palpations: Abdomen is soft.     Tenderness: There  is no abdominal tenderness.  Musculoskeletal:        General: No swelling.     Cervical back: Normal range of motion and neck supple.     Right lower leg: No edema.     Left lower leg: No edema.  Skin:    General: Skin is warm and dry.     Capillary Refill: Capillary refill takes less than 2 seconds.  Neurological:     Mental Status: He is alert and oriented to person, place, and time. Mental status is at baseline.     Comments: Patient seems to be back to baseline.  Finger-to-nose on the left side a little bit off.  Patient states that is from the previous stroke.  Psychiatric:        Mood and Affect: Mood normal.     ED Results / Procedures / Treatments   Labs (all labs ordered are listed, but only abnormal results are displayed) Labs Reviewed  CBC - Abnormal; Notable for the following components:      Result Value   Platelets 135 (*)    All other components within normal limits  COMPREHENSIVE METABOLIC PANEL WITH GFR - Abnormal; Notable for the following components:   Glucose, Bld 183 (*)    BUN 31 (*)    Creatinine, Ser 2.16 (*)    Alkaline Phosphatase 37 (*)    GFR, Estimated 33 (*)    All other components within normal limits  I-STAT CHEM 8, ED - Abnormal; Notable for the following components:   BUN 34 (*)    Creatinine, Ser 2.20 (*)    Glucose, Bld 186 (*)    TCO2 21 (*)    All other components within normal limits  CBG MONITORING, ED - Abnormal;  Notable for the following components:   Glucose-Capillary 193 (*)    All other components within normal limits  PROTIME-INR  APTT  DIFFERENTIAL  ETHANOL    EKG None  Radiology MR ANGIO HEAD WO CONTRAST Result Date: 02/13/2024 CLINICAL DATA:  Neuro deficit, acute, stroke suspected EXAM: MRI HEAD WITHOUT CONTRAST MRA HEAD WITHOUT CONTRAST MRA OF THE NECK WITHOUT AND WITH CONTRAST TECHNIQUE: Multiplanar, multi-echo pulse sequences of the brain and surrounding structures were acquired without intravenous contrast. Angiographic images of the Circle of Willis were acquired using MRA technique without intravenous contrast. Angiographic images of the neck were acquired using MRA technique without and with intravenous contrast. Carotid stenosis measurements (when applicable) are obtained utilizing NASCET criteria, using the distal internal carotid diameter as the denominator. CONTRAST:  8mL GADAVIST GADOBUTROL 1 MMOL/ML IV SOLN COMPARISON:  CT head Feb 13, 2024. FINDINGS: MR HEAD FINDINGS Brain: No acute infarction, hemorrhage, hydrocephalus, extra-axial collection or mass lesion. Vascular: See below. Skull and upper cervical spine: Normal marrow signal. Sinuses/Orbits: Clear sinuses.  No acute orbital findings. Other: Trace left mastoid effusion. MRA HEAD FINDINGS Anterior circulation: Bilateral intracranial ICAs, MCAs, and ACAs are patent without proximal hemodynamically significant stenosis. No aneurysm identified. Hypoplastic left A1 ACA. Posterior circulation: Bilateral intradural vertebral arteries, basilar artery and bilateral posterior cerebral arteries are patent without proximal hemodynamically significant stenosis. Bilateral fetal type PCAs, anatomic variant. No aneurysm identified. MRA NECK FINDINGS Aortic arch: Great vessel origins are patent without significant stenosis. Right carotid system: Patent without significant stenosis. Left carotid system: Patent without significant stenosis. Vertebral  arteries: Patent without significant stenosis. Codominant. IMPRESSION: 1. No evidence of acute intracranial abnormality. 2. No large vessel occlusion or proximal hemodynamically significant stenosis. Electronically Signed  By: Stevenson Elbe M.D.   On: 02/13/2024 21:47   MR Angiogram Neck W or Wo Contrast Result Date: 02/13/2024 CLINICAL DATA:  Neuro deficit, acute, stroke suspected EXAM: MRI HEAD WITHOUT CONTRAST MRA HEAD WITHOUT CONTRAST MRA OF THE NECK WITHOUT AND WITH CONTRAST TECHNIQUE: Multiplanar, multi-echo pulse sequences of the brain and surrounding structures were acquired without intravenous contrast. Angiographic images of the Circle of Willis were acquired using MRA technique without intravenous contrast. Angiographic images of the neck were acquired using MRA technique without and with intravenous contrast. Carotid stenosis measurements (when applicable) are obtained utilizing NASCET criteria, using the distal internal carotid diameter as the denominator. CONTRAST:  8mL GADAVIST GADOBUTROL 1 MMOL/ML IV SOLN COMPARISON:  CT head Feb 13, 2024. FINDINGS: MR HEAD FINDINGS Brain: No acute infarction, hemorrhage, hydrocephalus, extra-axial collection or mass lesion. Vascular: See below. Skull and upper cervical spine: Normal marrow signal. Sinuses/Orbits: Clear sinuses.  No acute orbital findings. Other: Trace left mastoid effusion. MRA HEAD FINDINGS Anterior circulation: Bilateral intracranial ICAs, MCAs, and ACAs are patent without proximal hemodynamically significant stenosis. No aneurysm identified. Hypoplastic left A1 ACA. Posterior circulation: Bilateral intradural vertebral arteries, basilar artery and bilateral posterior cerebral arteries are patent without proximal hemodynamically significant stenosis. Bilateral fetal type PCAs, anatomic variant. No aneurysm identified. MRA NECK FINDINGS Aortic arch: Great vessel origins are patent without significant stenosis. Right carotid system: Patent  without significant stenosis. Left carotid system: Patent without significant stenosis. Vertebral arteries: Patent without significant stenosis. Codominant. IMPRESSION: 1. No evidence of acute intracranial abnormality. 2. No large vessel occlusion or proximal hemodynamically significant stenosis. Electronically Signed   By: Stevenson Elbe M.D.   On: 02/13/2024 21:47   MR BRAIN WO CONTRAST Result Date: 02/13/2024 CLINICAL DATA:  Neuro deficit, acute, stroke suspected EXAM: MRI HEAD WITHOUT CONTRAST MRA HEAD WITHOUT CONTRAST MRA OF THE NECK WITHOUT AND WITH CONTRAST TECHNIQUE: Multiplanar, multi-echo pulse sequences of the brain and surrounding structures were acquired without intravenous contrast. Angiographic images of the Circle of Willis were acquired using MRA technique without intravenous contrast. Angiographic images of the neck were acquired using MRA technique without and with intravenous contrast. Carotid stenosis measurements (when applicable) are obtained utilizing NASCET criteria, using the distal internal carotid diameter as the denominator. CONTRAST:  8mL GADAVIST GADOBUTROL 1 MMOL/ML IV SOLN COMPARISON:  CT head Feb 13, 2024. FINDINGS: MR HEAD FINDINGS Brain: No acute infarction, hemorrhage, hydrocephalus, extra-axial collection or mass lesion. Vascular: See below. Skull and upper cervical spine: Normal marrow signal. Sinuses/Orbits: Clear sinuses.  No acute orbital findings. Other: Trace left mastoid effusion. MRA HEAD FINDINGS Anterior circulation: Bilateral intracranial ICAs, MCAs, and ACAs are patent without proximal hemodynamically significant stenosis. No aneurysm identified. Hypoplastic left A1 ACA. Posterior circulation: Bilateral intradural vertebral arteries, basilar artery and bilateral posterior cerebral arteries are patent without proximal hemodynamically significant stenosis. Bilateral fetal type PCAs, anatomic variant. No aneurysm identified. MRA NECK FINDINGS Aortic arch: Great  vessel origins are patent without significant stenosis. Right carotid system: Patent without significant stenosis. Left carotid system: Patent without significant stenosis. Vertebral arteries: Patent without significant stenosis. Codominant. IMPRESSION: 1. No evidence of acute intracranial abnormality. 2. No large vessel occlusion or proximal hemodynamically significant stenosis. Electronically Signed   By: Stevenson Elbe M.D.   On: 02/13/2024 21:47   CT HEAD WO CONTRAST Result Date: 02/13/2024 CLINICAL DATA:  Neuro deficit, concern for stroke, numbness. EXAM: CT HEAD WITHOUT CONTRAST TECHNIQUE: Contiguous axial images were obtained from the base of the skull through the  vertex without intravenous contrast. RADIATION DOSE REDUCTION: This exam was performed according to the departmental dose-optimization program which includes automated exposure control, adjustment of the mA and/or kV according to patient size and/or use of iterative reconstruction technique. COMPARISON:  None Available. FINDINGS: Brain: No acute intracranial hemorrhage. No CT evidence of acute infarct. No edema, mass effect, or midline shift. The basilar cisterns are patent. Ventricles: The ventricles are normal. Vascular: No hyperdense vessel or unexpected calcification. Skull: No acute or aggressive finding. Orbits: Orbits are symmetric. Sinuses: The visualized paranasal sinuses are clear. Other: Mastoid air cells are clear. IMPRESSION: No CT evidence of acute intracranial abnormality. Electronically Signed   By: Denny Flack M.D.   On: 02/13/2024 15:59    Procedures Procedures    Medications Ordered in ED Medications  clopidogrel (PLAVIX) tablet 300 mg (has no administration in time range)  gadobutrol (GADAVIST) 1 MMOL/ML injection 8 mL (8 mLs Intravenous Contrast Given 02/13/24 2047)    ED Course/ Medical Decision Making/ A&P                                 Medical Decision Making Amount and/or Complexity of Data  Reviewed Radiology: ordered.  Risk Prescription drug management.   Discussed with Dr. Alecia Ames,  He stated that since onset was gradual and resolution was gradual it may not be CVA.  Prickly with previous CVA affecting that side of the body he recommended MRI MRA without head and neck.  Those have been ordered.  If they are completely normal he could be discharged home.  With close follow-up.  Blood sugar is 193 INR is normal.  I-STAT without any significant abnormalities of the patient's creatinine was 2.20.  Patient's complete metabolic panel GFR is 33 with a creatinine of 2.16.  Alcohol  level 0.  Head CT without any acute findings.   MRI MRA head and neck without any acute findings.  Based on discussion with Dr. Portia Brittle patient should be stable for discharge home.  Follow-up with his doctors and return for any new or worse symptoms.   Neurology did recommend Plavix 300 mg here tonight and then 75 mg daily for the next 3 weeks.  Prescription provided.  In discussion with neurology said that TIA is not completely ruled out but he thinks that the whole presentation is to the patient patient was not very keen on that. Final Clinical Impression(s) / ED Diagnoses Final diagnoses:  Paresthesias  Weakness    Rx / DC Orders ED Discharge Orders          Ordered    clopidogrel (PLAVIX) 75 MG tablet  Daily        02/13/24 2257              Jataya Wann, MD 02/13/24 1610    Long Brimage, MD 02/13/24 2100    Tranae Laramie, MD 02/13/24 2252    Pernell Dikes, MD 02/13/24 2257    Elsie Sakuma, MD 02/13/24 2258

## 2024-02-13 NOTE — ED Triage Notes (Signed)
 Pt woke up at 0730 and felt normal. At about 0830 pt began having left sided numbness, tingling, weakness. Pt has history of stroke in 2012. During assessment pt is weaker on left side. A&OX4. Ambulatory to triage.

## 2024-02-13 NOTE — ED Notes (Signed)
 Neurology at bedside.

## 2024-02-16 ENCOUNTER — Other Ambulatory Visit: Payer: Self-pay

## 2024-02-16 ENCOUNTER — Inpatient Hospital Stay (HOSPITAL_COMMUNITY)

## 2024-02-16 ENCOUNTER — Inpatient Hospital Stay (HOSPITAL_COMMUNITY)
Admission: EM | Admit: 2024-02-16 | Discharge: 2024-02-17 | DRG: 062 | Disposition: A | Discharge status: Home / Self Care | Attending: Neurology | Admitting: Neurology

## 2024-02-16 ENCOUNTER — Encounter (HOSPITAL_COMMUNITY): Payer: Self-pay | Admitting: *Deleted

## 2024-02-16 ENCOUNTER — Emergency Department (HOSPITAL_COMMUNITY)

## 2024-02-16 DIAGNOSIS — I10 Essential (primary) hypertension: Secondary | ICD-10-CM | POA: Diagnosis present

## 2024-02-16 DIAGNOSIS — Z7984 Long term (current) use of oral hypoglycemic drugs: Secondary | ICD-10-CM

## 2024-02-16 DIAGNOSIS — Z8249 Family history of ischemic heart disease and other diseases of the circulatory system: Secondary | ICD-10-CM

## 2024-02-16 DIAGNOSIS — I639 Cerebral infarction, unspecified: Secondary | ICD-10-CM | POA: Diagnosis present

## 2024-02-16 DIAGNOSIS — E1140 Type 2 diabetes mellitus with diabetic neuropathy, unspecified: Secondary | ICD-10-CM | POA: Diagnosis present

## 2024-02-16 DIAGNOSIS — E785 Hyperlipidemia, unspecified: Secondary | ICD-10-CM

## 2024-02-16 DIAGNOSIS — Z8669 Personal history of other diseases of the nervous system and sense organs: Secondary | ICD-10-CM

## 2024-02-16 DIAGNOSIS — F5104 Psychophysiologic insomnia: Secondary | ICD-10-CM | POA: Diagnosis present

## 2024-02-16 DIAGNOSIS — Z87442 Personal history of urinary calculi: Secondary | ICD-10-CM

## 2024-02-16 DIAGNOSIS — Z9049 Acquired absence of other specified parts of digestive tract: Secondary | ICD-10-CM | POA: Diagnosis not present

## 2024-02-16 DIAGNOSIS — R29700 NIHSS score 0: Secondary | ICD-10-CM | POA: Diagnosis not present

## 2024-02-16 DIAGNOSIS — Z7902 Long term (current) use of antithrombotics/antiplatelets: Secondary | ICD-10-CM

## 2024-02-16 DIAGNOSIS — Z7985 Long-term (current) use of injectable non-insulin antidiabetic drugs: Secondary | ICD-10-CM

## 2024-02-16 DIAGNOSIS — I69354 Hemiplegia and hemiparesis following cerebral infarction affecting left non-dominant side: Secondary | ICD-10-CM | POA: Diagnosis not present

## 2024-02-16 DIAGNOSIS — E119 Type 2 diabetes mellitus without complications: Secondary | ICD-10-CM | POA: Diagnosis not present

## 2024-02-16 DIAGNOSIS — I447 Left bundle-branch block, unspecified: Secondary | ICD-10-CM | POA: Diagnosis present

## 2024-02-16 DIAGNOSIS — R262 Difficulty in walking, not elsewhere classified: Secondary | ICD-10-CM | POA: Diagnosis present

## 2024-02-16 DIAGNOSIS — G629 Polyneuropathy, unspecified: Secondary | ICD-10-CM

## 2024-02-16 DIAGNOSIS — I6389 Other cerebral infarction: Secondary | ICD-10-CM | POA: Diagnosis present

## 2024-02-16 DIAGNOSIS — Z79899 Other long term (current) drug therapy: Secondary | ICD-10-CM

## 2024-02-16 DIAGNOSIS — Z7982 Long term (current) use of aspirin: Secondary | ICD-10-CM

## 2024-02-16 DIAGNOSIS — R29810 Facial weakness: Secondary | ICD-10-CM | POA: Diagnosis present

## 2024-02-16 DIAGNOSIS — Z96651 Presence of right artificial knee joint: Secondary | ICD-10-CM | POA: Diagnosis present

## 2024-02-16 DIAGNOSIS — R29703 NIHSS score 3: Secondary | ICD-10-CM | POA: Diagnosis present

## 2024-02-16 DIAGNOSIS — E7800 Pure hypercholesterolemia, unspecified: Secondary | ICD-10-CM | POA: Diagnosis present

## 2024-02-16 LAB — RAPID URINE DRUG SCREEN, HOSP PERFORMED
Amphetamines: NOT DETECTED
Barbiturates: NOT DETECTED
Benzodiazepines: POSITIVE — AB
Cocaine: NOT DETECTED
Opiates: NOT DETECTED
Tetrahydrocannabinol: NOT DETECTED

## 2024-02-16 LAB — URINALYSIS, ROUTINE W REFLEX MICROSCOPIC
Bilirubin Urine: NEGATIVE
Glucose, UA: 50 mg/dL — AB
Hgb urine dipstick: NEGATIVE
Ketones, ur: NEGATIVE mg/dL
Leukocytes,Ua: NEGATIVE
Nitrite: NEGATIVE
Protein, ur: NEGATIVE mg/dL
Specific Gravity, Urine: 1.02 (ref 1.005–1.030)
pH: 5 (ref 5.0–8.0)

## 2024-02-16 LAB — I-STAT CHEM 8, ED
BUN: 31 mg/dL — ABNORMAL HIGH (ref 8–23)
Calcium, Ion: 1.1 mmol/L — ABNORMAL LOW (ref 1.15–1.40)
Chloride: 108 mmol/L (ref 98–111)
Creatinine, Ser: 2 mg/dL — ABNORMAL HIGH (ref 0.61–1.24)
Glucose, Bld: 217 mg/dL — ABNORMAL HIGH (ref 70–99)
HCT: 43 % (ref 39.0–52.0)
Hemoglobin: 14.6 g/dL (ref 13.0–17.0)
Potassium: 4.4 mmol/L (ref 3.5–5.1)
Sodium: 139 mmol/L (ref 135–145)
TCO2: 19 mmol/L — ABNORMAL LOW (ref 22–32)

## 2024-02-16 LAB — COMPREHENSIVE METABOLIC PANEL WITH GFR
ALT: 32 U/L (ref 0–44)
AST: 33 U/L (ref 15–41)
Albumin: 4.1 g/dL (ref 3.5–5.0)
Alkaline Phosphatase: 39 U/L (ref 38–126)
Anion gap: 12 (ref 5–15)
BUN: 26 mg/dL — ABNORMAL HIGH (ref 8–23)
CO2: 20 mmol/L — ABNORMAL LOW (ref 22–32)
Calcium: 9.3 mg/dL (ref 8.9–10.3)
Chloride: 107 mmol/L (ref 98–111)
Creatinine, Ser: 2.07 mg/dL — ABNORMAL HIGH (ref 0.61–1.24)
GFR, Estimated: 35 mL/min — ABNORMAL LOW (ref >60)
Glucose, Bld: 216 mg/dL — ABNORMAL HIGH (ref 70–99)
Potassium: 4.3 mmol/L (ref 3.5–5.1)
Sodium: 139 mmol/L (ref 135–145)
Total Bilirubin: 0.8 mg/dL (ref 0.0–1.2)
Total Protein: 7.1 g/dL (ref 6.5–8.1)

## 2024-02-16 LAB — ECHOCARDIOGRAM COMPLETE
Area-P 1/2: 2.31 cm2
Calc EF: 43.4 %
Height: 68 in
S' Lateral: 3.6 cm
Single Plane A2C EF: 46.4 %
Single Plane A4C EF: 43.6 %
Weight: 2719.59 [oz_av]

## 2024-02-16 LAB — DIFFERENTIAL
Abs Immature Granulocytes: 0.02 10*3/uL (ref 0.00–0.07)
Basophils Absolute: 0 10*3/uL (ref 0.0–0.1)
Basophils Relative: 1 %
Eosinophils Absolute: 0.1 10*3/uL (ref 0.0–0.5)
Eosinophils Relative: 1 %
Immature Granulocytes: 0 %
Lymphocytes Relative: 21 %
Lymphs Abs: 1.2 10*3/uL (ref 0.7–4.0)
Monocytes Absolute: 0.4 10*3/uL (ref 0.1–1.0)
Monocytes Relative: 6 %
Neutro Abs: 4.3 10*3/uL (ref 1.7–7.7)
Neutrophils Relative %: 71 %

## 2024-02-16 LAB — CBC
HCT: 43.5 % (ref 39.0–52.0)
Hemoglobin: 14.8 g/dL (ref 13.0–17.0)
MCH: 31.4 pg (ref 26.0–34.0)
MCHC: 34 g/dL (ref 30.0–36.0)
MCV: 92.2 fL (ref 80.0–100.0)
Platelets: 125 10*3/uL — ABNORMAL LOW (ref 150–400)
RBC: 4.72 MIL/uL (ref 4.22–5.81)
RDW: 11.8 % (ref 11.5–15.5)
WBC: 6 10*3/uL (ref 4.0–10.5)
nRBC: 0 % (ref 0.0–0.2)

## 2024-02-16 LAB — GLUCOSE, CAPILLARY
Glucose-Capillary: 149 mg/dL — ABNORMAL HIGH (ref 70–99)
Glucose-Capillary: 109 mg/dL — ABNORMAL HIGH (ref 70–99)

## 2024-02-16 LAB — CBG MONITORING, ED: Glucose-Capillary: 215 mg/dL — ABNORMAL HIGH (ref 70–99)

## 2024-02-16 LAB — APTT: aPTT: 28 s (ref 24–36)

## 2024-02-16 LAB — MRSA NEXT GEN BY PCR, NASAL: MRSA by PCR Next Gen: NOT DETECTED

## 2024-02-16 LAB — PROTIME-INR
INR: 1.1 (ref 0.8–1.2)
Prothrombin Time: 14 s (ref 11.4–15.2)

## 2024-02-16 LAB — ETHANOL: Alcohol, Ethyl (B): <15 mg/dL (ref <15)

## 2024-02-16 MED ORDER — ACETAMINOPHEN 325 MG PO TABS: 650.0000 mg | ORAL_TABLET | ORAL | Status: DC | PRN

## 2024-02-16 MED ORDER — INSULIN ASPART 100 UNIT/ML IJ SOLN
0.0000 [IU] | Freq: Three times a day (TID) | INTRAMUSCULAR | Status: DC
  Administered 2024-02-16: 1 [IU] via SUBCUTANEOUS
  Administered 2024-02-17: 2 [IU] via SUBCUTANEOUS
  Administered 2024-02-17: 1 [IU] via SUBCUTANEOUS

## 2024-02-16 MED ORDER — ROSUVASTATIN CALCIUM 20 MG PO TABS
20.0000 mg | ORAL_TABLET | Freq: Every day | ORAL | Status: DC
  Administered 2024-02-16 – 2024-02-17 (×2): 20 mg via ORAL
  Filled 2024-02-16 (×2): qty 1

## 2024-02-16 MED ORDER — CHLORHEXIDINE GLUCONATE CLOTH 2 % EX PADS
6.0000 | MEDICATED_PAD | Freq: Every day | CUTANEOUS | Status: DC
  Administered 2024-02-16: 6 via TOPICAL

## 2024-02-16 MED ORDER — STROKE: EARLY STAGES OF RECOVERY BOOK
Freq: Once | Status: AC
  Filled 2024-02-16: qty 1

## 2024-02-16 MED ORDER — PANTOPRAZOLE SODIUM 40 MG IV SOLR
40.0000 mg | Freq: Every day | INTRAVENOUS | Status: DC
  Administered 2024-02-16: 40 mg via INTRAVENOUS
  Filled 2024-02-16: qty 10

## 2024-02-16 MED ORDER — ACETAMINOPHEN 160 MG/5ML PO SOLN: 650.0000 mg | ORAL | Status: DC | PRN

## 2024-02-16 MED ORDER — TEMAZEPAM 15 MG PO CAPS
15.0000 mg | ORAL_CAPSULE | Freq: Every evening | ORAL | Status: DC | PRN
  Administered 2024-02-16: 15 mg via ORAL
  Filled 2024-02-16: qty 1

## 2024-02-16 MED ORDER — TENECTEPLASE FOR STROKE
0.2500 mg/kg | PACK | Freq: Once | INTRAVENOUS | Status: AC
  Administered 2024-02-16: 19 mg via INTRAVENOUS
  Filled 2024-02-16: qty 10

## 2024-02-16 MED ORDER — LABETALOL HCL 5 MG/ML IV SOLN: 20.0000 mg | Freq: Once | INTRAVENOUS | Status: DC

## 2024-02-16 MED ORDER — SODIUM CHLORIDE 0.9 % IV SOLN: INTRAVENOUS | Status: DC

## 2024-02-16 MED ORDER — ACETAMINOPHEN 650 MG RE SUPP: 650.0000 mg | RECTAL | Status: DC | PRN

## 2024-02-16 MED ORDER — CLEVIDIPINE BUTYRATE 0.5 MG/ML IV EMUL
0.0000 mg/h | INTRAVENOUS | Status: DC
Start: 2024-02-16 — End: 2024-02-17

## 2024-02-16 MED ORDER — ORAL CARE MOUTH RINSE: 15.0000 mL | OROMUCOSAL | Status: DC | PRN

## 2024-02-16 MED ORDER — LEVETIRACETAM ER 500 MG PO TB24
1000.0000 mg | ORAL_TABLET | Freq: Every day | ORAL | Status: DC
  Administered 2024-02-16 – 2024-02-17 (×2): 1000 mg via ORAL
  Filled 2024-02-16 (×2): qty 2

## 2024-02-16 MED ORDER — SODIUM CHLORIDE 0.9% FLUSH
3.0000 mL | Freq: Once | INTRAVENOUS | Status: AC
  Administered 2024-02-16: 3 mL via INTRAVENOUS

## 2024-02-16 MED ORDER — SENNOSIDES-DOCUSATE SODIUM 8.6-50 MG PO TABS: 1.0000 | ORAL_TABLET | Freq: Every evening | ORAL | Status: DC | PRN

## 2024-02-16 NOTE — ED Provider Notes (Signed)
 [place] EMERGENCY DEPARTMENT AT [facility] Provider Note   CSN: [id number] Arrival date & time: 02/16/24  5956  An emergency department physician performed an initial assessment on this suspected stroke patient at 1010.  History  Chief Complaint  Patient presents with   Weakness    Gordon Christian is a 63 y.o. male.  HPI     63yo male with history of DM, htn, hyperlipidemia, prior right pontine stroke, recent emergency department v May 16 with transient episode of left-sided tingling and numbness who presents with concern for left sided numbness and weakness.   Reports when he woke up this morning he felt okay was able to ambulate and felt himself.  This was around 730.  At 830, he began to notice left leg weakness making it difficult for him to ambulate.  Reports that its left leg weakness and numbness that is now progressing to his left arm.  He denies any difficulty speaking, change in vision, headache, falls, history of easy bleeding. He was started on plavix  at recent admission.   Past Medical History:  Diagnosis Date   Arthritis    Diabetes mellitus without complication (HCC)    Essential hypertension 03/23/2023   History of kidney stones    Hypercholesteremia    Stroke [facility]      Home Medications Prior to Admission medications   Medication Sig Start Date End Date Taking? Authorizing Provider  Lancets [facility] DELICA PLUS Crystal) MISC Use to check blood sugar three times daily 12/10/23     acetaminophen  (TYLENOL ) 650 MG CR tablet Take 1,300 mg by mouth every 8 (eight) hours as needed for pain.    [provider]  aspirin  325 MG tablet Take 325 mg by mouth 2 (two) times daily.    [provider]  Blood Glucose Monitoring Suppl (ONETOUCH VERIO) w/Device KIT Use as directed by physician to monitor blood sugars 12/12/23     Carboxymethylcellul-Glycerin (LUBRICATING EYE DROPS OP) Place 1 drop into both eyes daily as needed (dry eyes).     [provider]  clopidogrel  (PLAVIX ) 75 MG tablet Take 1 tablet (75 mg total) by mouth daily. 02/13/24   Zackowski, Scott, MD  docusate sodium  (COLACE) 100 MG capsule Take 1 capsule (100 mg total) by mouth 2 (two) times daily as needed for mild constipation. 09/04/22   Orvan Blanch, MD  gabapentin  (NEURONTIN ) 300 MG capsule Take 300 mg by mouth 2 (two) times daily.    [provider]  gabapentin  (NEURONTIN ) 300 MG capsule Take 1 capsule (300 mg total) by mouth 3 (three) times daily. 12/10/23     glucose blood (ACCU-CHEK GUIDE TEST) test strip USE TO CHECK BLOOD SUGAR 3 TIMES DAILY 12/10/23     glucose blood (ONETOUCH VERIO) test strip USE TO CHECK BLOOD SUGAR 3 TIMES DAILY 12/12/23     Lancets (ONETOUCH DELICA PLUS LANCET33G) MISC Use to check blood sugars three times daily. 12/12/23     levETIRAcetam  (KEPPRA ) 1000 MG tablet Take 1,000 mg by mouth at bedtime.    [provider]  levETIRAcetam  (KEPPRA ) 1000 MG tablet Take 1 tablet (1,000 mg total) by mouth every evening. 10/30/23     levETIRAcetam  (KEPPRA ) 1000 MG tablet Take 1 tablet (1,000 mg total) by mouth at bedtime. 12/10/23     lisinopril -hydrochlorothiazide  (ZESTORETIC ) 20-12.5 MG tablet Take 1 tablet by mouth daily. 12/10/23     metFORMIN (GLUCOPHAGE) 500 MG tablet Take 500 mg by mouth 2 (two) times daily.  [provider]  ondansetron  (ZOFRAN -ODT) 4 MG disintegrating tablet Take 1 tablet (4 mg total) by mouth every 8 (eight) hours as needed for nausea or vomiting. 03/22/23   Henderly, Britni A, PA-C  oxyCODONE -acetaminophen  (PERCOCET/ROXICET) 5-325 MG tablet Take 1 tablet by mouth every 6 (six) hours as needed for severe pain. 03/22/23   Henderly, Britni A, PA-C  polyethylene glycol (MIRALAX  / GLYCOLAX ) 17 g packet Take 17 g by mouth daily. Patient not taking: Reported on 03/22/2023 09/04/22   Orvan Blanch, MD  rosuvastatin  (CRESTOR ) 20 MG tablet Take 20 mg by mouth every evening.    [provider]   rosuvastatin  (CRESTOR ) 20 MG tablet Take 1 tablet (20 mg total) by mouth at bedtime. 12/10/23     Semaglutide , 1 MG/DOSE, (OZEMPIC , 1 MG/DOSE,) 4 MG/3ML SOPN Inject 1 mg into the skin once a week. 12/10/23     Semaglutide ,0.25 or 0.5MG /DOS, (OZEMPIC , 0.25 OR 0.5 MG/DOSE,) 2 MG/3ML SOPN Inject 0.5 mg into the skin once a week. 12/10/23     Semaglutide ,0.25 or 0.5MG /DOS, 2 MG/3ML SOPN Inject 0.5 mg into the skin once a week. 02/06/23   [provider]  temazepam  (RESTORIL ) 15 MG capsule Take 15 mg by mouth at bedtime as needed for sleep.    [provider]  temazepam  (RESTORIL ) 15 MG capsule Take 1 capsule (15 mg total) by mouth at bedtime as needed for sleep. 10/30/23     tiZANidine  (ZANAFLEX ) 4 MG tablet Take 4 mg by mouth at bedtime.    [provider]  traZODone  (DESYREL ) 100 MG tablet Take 1 tablet (100 mg total) by mouth at bedtime. 12/02/23     Vitamin D , Ergocalciferol , (DRISDOL ) 1.25 MG (50000 UNIT) CAPS capsule Take 50,000 Units by mouth every Wednesday.    [provider]  Vitamin D , Ergocalciferol , (DRISDOL ) 1.25 MG (50000 UNIT) CAPS capsule Take 1 capsule (50,000 Units total) by mouth once a week. 12/10/23     vitamin E 1000 UNIT capsule Take 1,000 Units by mouth daily.    [provider]      Allergies    Patient has no known allergies.    Review of Systems   Review of Systems  Physical Exam Updated Vital Signs BP 116/74   Pulse 71   Temp 98 F (36.7 C) (Oral)   Resp 14   Ht 5\' 8"  (1.727 m)   Wt 77.1 kg   SpO2 92%   BMI 25.84 kg/m  Physical Exam Vitals and nursing note reviewed.  Constitutional:      General: He is not in acute distress.    Appearance: Normal appearance. He is well-developed. He is not ill-appearing or diaphoretic.  HENT:     Head: Normocephalic and atraumatic.  Eyes:     General: No visual field deficit.    Extraocular Movements: Extraocular movements intact.     Conjunctiva/sclera: Conjunctivae normal.      Pupils: Pupils are equal, round, and reactive to light.  Cardiovascular:     Rate and Rhythm: Normal rate and regular rhythm.     Pulses: Normal pulses.     Heart sounds: Normal heart sounds. No murmur heard.    No friction rub. No gallop.  Pulmonary:     Effort: Pulmonary effort is normal. No respiratory distress.     Breath sounds: Normal breath sounds. No wheezing or rales.  Abdominal:     General: There is no distension.     Palpations: Abdomen is soft.  Tenderness: There is no abdominal tenderness. There is no guarding.  Musculoskeletal:        General: No swelling or tenderness.     Cervical back: Normal range of motion.  Skin:    General: Skin is warm and dry.     Findings: No erythema or rash.  Neurological:     General: No focal deficit present.     Mental Status: He is alert and oriented to person, place, and time.     GCS: GCS eye subscore is 4. GCS verbal subscore is 5. GCS motor subscore is 6.     Cranial Nerves: No cranial nerve deficit, dysarthria or facial asymmetry.     Sensory: No sensory deficit.     Motor: Weakness (left grip, slight pronation left arm, lef leg with weakness, both left arm, face, hand with numnbness) present. No tremor.     Coordination: Coordination abnormal (left). Finger-Nose-Finger Test normal.     Gait: Gait normal.     ED Results / Procedures / Treatments   Labs (all labs ordered are listed, but only abnormal results are displayed) Labs Reviewed  CBC - Abnormal; Notable for the following components:      Result Value   Platelets 125 (*)    All other components within normal limits  COMPREHENSIVE METABOLIC PANEL WITH GFR - Abnormal; Notable for the following components:   CO2 20 (*)    Glucose, Bld 216 (*)    BUN 26 (*)    Creatinine, Ser 2.07 (*)    GFR, Estimated 35 (*)    All other components within normal limits  RAPID URINE DRUG SCREEN, HOSP PERFORMED - Abnormal; Notable for the following components:   Benzodiazepines  POSITIVE (*)    All other components within normal limits  URINALYSIS, ROUTINE W REFLEX MICROSCOPIC - Abnormal; Notable for the following components:   Color, Urine AMBER (*)    APPearance CLOUDY (*)    Glucose, UA 50 (*)    All other components within normal limits  GLUCOSE, CAPILLARY - Abnormal; Notable for the following components:   Glucose-Capillary 149 (*)    All other components within normal limits  GLUCOSE, CAPILLARY - Abnormal; Notable for the following components:   Glucose-Capillary 109 (*)    All other components within normal limits  CBG MONITORING, ED - Abnormal; Notable for the following components:   Glucose-Capillary 215 (*)    All other components within normal limits  I-STAT CHEM 8, ED - Abnormal; Notable for the following components:   BUN 31 (*)    Creatinine, Ser 2.00 (*)    Glucose, Bld 217 (*)    Calcium , Ion 1.10 (*)    TCO2 19 (*)    All other components within normal limits  MRSA NEXT GEN BY PCR, NASAL  PROTIME-INR  APTT  DIFFERENTIAL  ETHANOL  LIPID PANEL  HEMOGLOBIN A1C  CBC  COMPREHENSIVE METABOLIC PANEL WITH GFR  CBG MONITORING, ED    EKG None  Radiology ECHOCARDIOGRAM COMPLETE Result Date: 02/16/2024    ECHOCARDIOGRAM REPORT   Patient Name:   Gordon Christian Date of Exam: 02/16/2024 Medical Rec #:  [id number]  Height:       68.0 in Accession #:    [id number] Weight:       170.0 lb Date of Birth:  May 25, 1961  BSA:          1.907 m Patient Age:    63 years   BP:  110/75 mmHg Patient Gender: M          HR:           62 bpm. Exam Location:  Inpatient Procedure: 2D Echo, Color Doppler and Cardiac Doppler (Both Spectral and Color            Flow Doppler were utilized during procedure). Indications:    Stroke i63.9  History:        Patient has no prior history of Echocardiogram examinations.                 Risk Factors:Hypertension, Diabetes and Dyslipidemia.  Sonographer:    Sherline Distel Senior RDCS Referring Phys: [id number] CORTNEY E DE [place]OR[place]  IMPRESSIONS  1. Left ventricular ejection fraction, by estimation, is 45 to 50%. The left ventricle has mildly decreased function. The left ventricle demonstrates global hypokinesis. There is mild left ventricular hypertrophy of the basal-septal segment. Left ventricular diastolic parameters are consistent with Grade I diastolic dysfunction (impaired relaxation).  2. Right ventricular systolic function is normal. The right ventricular size is normal.  3. Left atrial size was mildly dilated.  4. The mitral valve is normal in structure. Trivial mitral valve regurgitation. No evidence of mitral stenosis.  5. The aortic valve is tricuspid. Aortic valve regurgitation is not visualized. No aortic stenosis is present.  6. Aortic dilatation noted. There is mild dilatation of the aortic root, measuring 41 mm. Comparison(s): No prior Echocardiogram. FINDINGS  Left Ventricle: Left ventricular ejection fraction, by estimation, is 45 to 50%. The left ventricle has mildly decreased function. The left ventricle demonstrates global hypokinesis. The left ventricular internal cavity size was normal in size. There is  mild left ventricular hypertrophy of the basal-septal segment. Abnormal (paradoxical) septal motion, consistent with left bundle branch block. Left ventricular diastolic parameters are consistent with Grade I diastolic dysfunction (impaired relaxation). Right Ventricle: The right ventricular size is normal. Right ventricular systolic function is normal. Left Atrium: Left atrial size was mildly dilated. Right Atrium: Right atrial size was normal in size. Pericardium: There is no evidence of pericardial effusion. Mitral Valve: The mitral valve is normal in structure. Trivial mitral valve regurgitation. No evidence of mitral valve stenosis. Tricuspid Valve: The tricuspid valve is normal in structure. Tricuspid valve regurgitation is trivial. No evidence of tricuspid stenosis. Aortic Valve: The aortic valve is tricuspid.  Aortic valve regurgitation is not visualized. No aortic stenosis is present. Pulmonic Valve: The pulmonic valve was not well visualized. Pulmonic valve regurgitation is not visualized. No evidence of pulmonic stenosis. Aorta: Aortic dilatation noted. There is mild dilatation of the aortic root, measuring 41 mm. Venous: The inferior vena cava was not well visualized. IAS/Shunts: The interatrial septum was not well visualized.  LEFT VENTRICLE PLAX 2D LVIDd:         4.20 cm     Diastology LVIDs:         3.60 cm     LV e' medial:    5.11 cm/s LV PW:         0.70 cm     LV E/e' medial:  9.3 LV IVS:        1.50 cm     LV e' lateral:   6.09 cm/s LVOT diam:     2.50 cm     LV E/e' lateral: 7.8 LV SV:         98 LV SV Index:   51 LVOT Area:     4.91 cm  LV Volumes (MOD) LV  vol d, MOD A2C: 88.7 ml LV vol d, MOD A4C: 79.9 ml LV vol s, MOD A2C: 47.5 ml LV vol s, MOD A4C: 45.1 ml LV SV MOD A2C:     41.2 ml LV SV MOD A4C:     79.9 ml LV SV MOD BP:      36.6 ml RIGHT VENTRICLE RV S prime:     11.70 cm/s TAPSE (M-mode): 2.4 cm LEFT ATRIUM             Index        RIGHT ATRIUM           Index LA diam:        3.70 cm 1.94 cm/m   RA Area:     21.70 cm LA Vol (A2C):   68.5 ml 35.92 ml/m  RA Volume:   62.10 ml  32.56 ml/m LA Vol (A4C):   61.9 ml 32.46 ml/m LA Biplane Vol: 66.5 ml 34.87 ml/m  AORTIC VALVE LVOT Vmax:   101.00 cm/s LVOT Vmean:  74.200 cm/s LVOT VTI:    0.200 m  AORTA Ao Root diam: 4.10 cm MITRAL VALVE               TRICUSPID VALVE MV Area (PHT): 2.31 cm    TR Peak grad:   11.6 mmHg MV Decel Time: 329 msec    TR Vmax:        170.00 cm/s MV E velocity: 47.40 cm/s MV A velocity: 64.30 cm/s  SHUNTS MV E/A ratio:  0.74        Systemic VTI:  0.20 m                            Systemic Diam: 2.50 cm Alexandria Angel MD Electronically signed by Alexandria Angel MD Signature Date/Time: 02/16/2024/3:04:04 PM    Final    CT HEAD CODE STROKE WO CONTRAST Result Date: 02/16/2024 CLINICAL DATA:  Code stroke. Provided history: Neuro  deficit, acute, stroke suspected. Left-sided weakness. EXAM: CT HEAD WITHOUT CONTRAST TECHNIQUE: Contiguous axial images were obtained from the base of the skull through the vertex without intravenous contrast. RADIATION DOSE REDUCTION: This exam was performed according to the departmental dose-optimization program which includes automated exposure control, adjustment of the mA and/or kV according to patient size and/or use of iterative reconstruction technique. COMPARISON:  Brain MRI 02/13/2024. FINDINGS: Brain: No age-advanced or lobar predominant cerebral atrophy. There is no acute intracranial hemorrhage. No demarcated cortical infarct. No extra-axial fluid collection. No evidence of an intracranial mass. No midline shift. Vascular: No hyperdense vessel.  Atherosclerotic calcifications. Skull: No calvarial fracture or aggressive osseous lesion. Sinuses/Orbits: No mass or acute finding within the imaged orbits. Small-volume secretions within a posterior right ethmoid air cell. ASPECTS [facility] Stroke Program Early CT Score) - Ganglionic level infarction (caudate, lentiform nuclei, internal capsule, insula, M1-M3 cortex): 7 - Supraganglionic infarction (M4-M6 cortex): 3 Total score (0-10 with 10 being normal): 10 No acute intracranial finding. These results were communicated to Dr. Cleone Dad at 10:23 amon 5/19/2025by text page via the [facility] messaging system. IMPRESSION: No acute intracranial finding. ASPECTS is 10. Electronically Signed   By: Bascom Lily D.O.   On: 02/16/2024 10:24    Procedures Procedures    Medications Ordered in ED Medications   stroke: early stages of recovery book (has no administration in time range)  0.9 %  sodium chloride  infusion ( Intravenous Infusion Verify 02/17/24 0100)  acetaminophen  (TYLENOL ) tablet  650 mg (has no administration in time range)    Or  acetaminophen  (TYLENOL ) 160 MG/5ML solution 650 mg (has no administration in time range)    Or  acetaminophen  (TYLENOL )  suppository 650 mg (has no administration in time range)  senna-docusate (Senokot-S) tablet 1 tablet (has no administration in time range)  pantoprazole  (PROTONIX ) injection 40 mg (40 mg Intravenous Given 02/16/24 2016)  labetalol  (NORMODYNE ) injection 20 mg (20 mg Intravenous Not Given 02/16/24 1249)    And  clevidipine  (CLEVIPREX ) infusion 0.5 mg/mL (0 mg/hr Intravenous Hold 02/16/24 1249)  temazepam  (RESTORIL ) capsule 15 mg (15 mg Oral Given 02/16/24 2230)  levETIRAcetam  (KEPPRA  XR) 24 hr tablet 1,000 mg (1,000 mg Oral Given 02/16/24 1318)  rosuvastatin  (CRESTOR ) tablet 20 mg (20 mg Oral Given 02/16/24 1248)  insulin  aspart (novoLOG ) injection 0-9 Units (1 Units Subcutaneous Given 02/16/24 1726)  Chlorhexidine  Gluconate Cloth 2 % PADS 6 each (6 each Topical Given 02/16/24 1600)  Oral care mouth rinse (has no administration in time range)  sodium chloride  flush (NS) 0.9 % injection 3 mL (3 mLs Intravenous Given 02/16/24 1249)  tenecteplase  (TNKASE ) injection for Stroke 19 mg (19 mg Intravenous Given 02/16/24 1025)    ED Course/ Medical Decision Making/ A&P                                  63yo male with history of DM, htn, hyperlipidemia, prior right pontine stroke, recent emergency department v May 16 with transient episode of left-sided tingling and numbness who presents with concern for left sided numbness and weakness.   Differential diagnosis includes intracranial bleed, CVA, electrolyte abnormalities, complicated migraine. Labs completed and personally eval and interpreted by me show no clinically significant electrolyte abnormalities or anemia. CODE STROKE called on arrival. CT head was completed and evaluated by me, neurology and radiology and shows no evidence of intracranial bleed.  Dr. Cleone Dad of neurology was at bedside and discussed options of care.  He has been consented for TNKase  and will be transferred to the ICU for further care.         Final Clinical Impression(s) / ED  Diagnoses Final diagnoses:  Cerebrovascular accident (CVA), unspecified mechanism [facility])    Rx / DC Orders ED Discharge Orders     None         Scarlette Currier, MD 02/17/24 [phone]

## 2024-02-16 NOTE — Progress Notes (Signed)
 Echocardiogram 2D Echocardiogram has been performed.  Emmaline Haring Pharrah Rottman RDCS 02/16/2024, 2:51 PM

## 2024-02-16 NOTE — H&P (Signed)
 NEUROLOGY H&P NOTE   Date of service: Feb 16, 2024 Patient Name: Gordon Christian MRN:  [id number] DOB:  04/15/1961 Chief Complaint: "Recurrent left sided weakness" Requesting Provider: Stroke, Md, MD  History of Present Illness  Gordon Christian is a 63 y.o.  with past medical history significant for prior right pontine stroke in 2012 with left-sided numbness/weakness without residual deficits, hypertension, type 2 diabetes, hyperlipidemia, left bundle branch block, right lumbar radiculopathy and right carpal tunnel syndrome  He presented on 02/13/2024 with transient episode of left-sided tingling and numbness that started in his left leg with progression to his arm after about 45 minutes with the entire event lasting approximately 1 hour.  Description was felt to be atypical for stroke, but potentially consistent with small vessel thalamic disease or complicated migraine.  He was started on Plavix  with 300 mg loading dose followed by 75 mg daily for 3-week course.  Echo was deferred to the outpatient setting in shared decision making with the patient due to low concern for cardioembolic source  Per neurology note from November 2012 he had a right pontine stroke for which he was placed on Aggrenox and aspirin .  He reports that the main symptoms from that stroke were imbalance and discoordination which have largely resolved.  He reports he had some easy bruising on Aggrenox  He notes that he had had several episodes similar to this prior to presenting on 5/16 but none since his negative MRI obtained during that admission  Regarding his seizure history, patient had 2 seizures in 2018, does not remember anything of these episodes but states that he was told that he had generalized jerking movements, has taken his Keppra  every day since then, has had no recurrence of seizures and has not missed any recent doses of his Keppra .  He does regularly take gabapentin  and temazepam   Patient reports no other recent  illnesses or symptoms.  He is active and plays pickleball and golf regularly.  Discussed CODE STATUS with patient, and he wishes to be full code at this time.  LKW: Sudden onset at 8:30 AM Modified rankin score: 0-Completely asymptomatic and back to baseline post- stroke IV Thrombolysis: Yes 10:25 AM.  Risks benefits and alternatives were discussed with the patient prior to administration, and checklist of contraindications was reviewed with patient prior to administration EVT: No, exam not consistent with LVO   NIHSS components Score: Comment  1a Level of Conscious 0[x]  1[]  2[]  3[]      1b LOC Questions 0[x]  1[]  2[]       1c LOC Commands 0[x]  1[]  2[]       2 Best Gaze 0[x]  1[]  2[]       3 Visual 0[x]  1[]  2[]  3[]      4 Facial Palsy 0[x]  1[]  2[]  3[]      5a Motor Arm - left 0[x]  1[]  2[]  3[]  4[]  UN[]    5b Motor Arm - Right 0[x]  1[]  2[]  3[]  4[]  UN[]    6a Motor Leg - Left 0[]  1[x]  2[]  3[]  4[]  UN[]    6b Motor Leg - Right 0[x]  1[]  2[]  3[]  4[]  UN[]    7 Limb Ataxia 0[]  1[]  2[x]  UN[]      8 Sensory 0[x]  1[]  2[]  UN[]      9 Best Language 0[x]  1[]  2[]  3[]      10 Dysarthria 0[x]  1[]  2[]  UN[]      11 Extinct. and Inattention 0[x]  1[]  2[]       TOTAL: 3       ROS  Comprehensive ROS performed and pertinent  positives documented in HPI   Past History   Past Medical History:  Diagnosis Date   Arthritis    Diabetes mellitus without complication (HCC)    Essential hypertension 03/23/2023   History of kidney stones    Hypercholesteremia    Stroke [facility])     Past Surgical History:  Procedure Laterality Date   APPENDECTOMY     CHOLECYSTECTOMY     CYSTOSCOPY W/ URETERAL STENT PLACEMENT     KNEE ARTHROSCOPY Right    SHOULDER ARTHROSCOPY Right 1999   TOTAL KNEE ARTHROPLASTY Right 09/04/2022   Procedure: TOTAL KNEE ARTHROPLASTY;  Surgeon: Orvan Blanch, MD;  Location: [facility];  Service: Orthopedics;  Laterality: Right;    Family History: Family History  Problem Relation Age of Onset   Hypertension  Mother    Hypertension Father     Social History  reports that he has never smoked. He has never used smokeless tobacco. He reports current alcohol  use. He reports that he does not use drugs.  No Known Allergies  Medications   Current Facility-Administered Medications:    sodium chloride  flush (NS) 0.9 % injection 3 mL, 3 mL, Intravenous, Once, Scarlette Currier, MD   tenecteplase  (TNKASE ) injection for Stroke 19 mg, 0.25 mg/kg, Intravenous, Once, Scarlette Currier, MD  Current Outpatient Medications:    Lancets (ONETOUCH DELICA PLUS LANCET33G) MISC, Use to check blood sugar three times daily, Disp: 300 each, Rfl: 3   acetaminophen  (TYLENOL ) 650 MG CR tablet, Take 1,300 mg by mouth every 8 (eight) hours as needed for pain., Disp: , Rfl:    aspirin  325 MG tablet, Take 325 mg by mouth 2 (two) times daily., Disp: , Rfl:    Blood Glucose Monitoring Suppl (ONETOUCH VERIO) w/Device KIT, Use as directed by physician to monitor blood sugars, Disp: 1 kit, Rfl: 0   Carboxymethylcellul-Glycerin (LUBRICATING EYE DROPS OP), Place 1 drop into both eyes daily as needed (dry eyes)., Disp: , Rfl:    clopidogrel  (PLAVIX ) 75 MG tablet, Take 1 tablet (75 mg total) by mouth daily., Disp: 21 tablet, Rfl: 0   docusate sodium  (COLACE) 100 MG capsule, Take 1 capsule (100 mg total) by mouth 2 (two) times daily as needed for mild constipation., Disp: 30 capsule, Rfl: 1   gabapentin  (NEURONTIN ) 300 MG capsule, Take 300 mg by mouth 2 (two) times daily., Disp: , Rfl:    gabapentin  (NEURONTIN ) 300 MG capsule, Take 1 capsule (300 mg total) by mouth 3 (three) times daily., Disp: 300 capsule, Rfl: 0   glucose blood (ACCU-CHEK GUIDE TEST) test strip, USE TO CHECK BLOOD SUGAR 3 TIMES DAILY, Disp: 300 each, Rfl: 3   glucose blood (ONETOUCH VERIO) test strip, USE TO CHECK BLOOD SUGAR 3 TIMES DAILY, Disp: 300 each, Rfl: 3   Lancets (ONETOUCH DELICA PLUS LANCET33G) MISC, Use to check blood sugars three times daily., Disp: 300  each, Rfl: 3   levETIRAcetam  (KEPPRA ) 1000 MG tablet, Take 1,000 mg by mouth at bedtime., Disp: , Rfl:    levETIRAcetam  (KEPPRA ) 1000 MG tablet, Take 1 tablet (1,000 mg total) by mouth every evening., Disp: 30 tablet, Rfl: 12   levETIRAcetam  (KEPPRA ) 1000 MG tablet, Take 1 tablet (1,000 mg total) by mouth at bedtime., Disp: 100 tablet, Rfl: 0   lisinopril -hydrochlorothiazide  (ZESTORETIC ) 20-12.5 MG tablet, Take 1 tablet by mouth daily., Disp: 90 tablet, Rfl: 0   metFORMIN (GLUCOPHAGE) 500 MG tablet, Take 500 mg by mouth 2 (two) times daily., Disp: , Rfl:    ondansetron  (ZOFRAN -ODT) 4  MG disintegrating tablet, Take 1 tablet (4 mg total) by mouth every 8 (eight) hours as needed for nausea or vomiting., Disp: 20 tablet, Rfl: 0   oxyCODONE -acetaminophen  (PERCOCET/ROXICET) 5-325 MG tablet, Take 1 tablet by mouth every 6 (six) hours as needed for severe pain., Disp: 5 tablet, Rfl: 0   polyethylene glycol (MIRALAX  / GLYCOLAX ) 17 g packet, Take 17 g by mouth daily. (Patient not taking: Reported on 03/22/2023), Disp: 14 each, Rfl: 0   rosuvastatin  (CRESTOR ) 20 MG tablet, Take 20 mg by mouth every evening., Disp: , Rfl:    rosuvastatin  (CRESTOR ) 20 MG tablet, Take 1 tablet (20 mg total) by mouth at bedtime., Disp: 100 tablet, Rfl: 0   Semaglutide , 1 MG/DOSE, (OZEMPIC , 1 MG/DOSE,) 4 MG/3ML SOPN, Inject 1 mg into the skin once a week., Disp: 3 mL, Rfl: 3   Semaglutide ,0.25 or 0.5MG /DOS, (OZEMPIC , 0.25 OR 0.5 MG/DOSE,) 2 MG/3ML SOPN, Inject 0.5 mg into the skin once a week., Disp: 9 mL, Rfl: 0   Semaglutide ,0.25 or 0.5MG /DOS, 2 MG/3ML SOPN, Inject 0.5 mg into the skin once a week., Disp: , Rfl:    temazepam  (RESTORIL ) 15 MG capsule, Take 15 mg by mouth at bedtime as needed for sleep., Disp: , Rfl:    temazepam  (RESTORIL ) 15 MG capsule, Take 1 capsule (15 mg total) by mouth at bedtime as needed for sleep., Disp: 90 capsule, Rfl: 1   tiZANidine  (ZANAFLEX ) 4 MG tablet, Take 4 mg by mouth at bedtime., Disp: , Rfl:     traZODone  (DESYREL ) 100 MG tablet, Take 1 tablet (100 mg total) by mouth at bedtime., Disp: 30 tablet, Rfl: 4   Vitamin D , Ergocalciferol , (DRISDOL ) 1.25 MG (50000 UNIT) CAPS capsule, Take 50,000 Units by mouth every Wednesday., Disp: , Rfl:    Vitamin D , Ergocalciferol , (DRISDOL ) 1.25 MG (50000 UNIT) CAPS capsule, Take 1 capsule (50,000 Units total) by mouth once a week., Disp: 13 capsule, Rfl: 0   vitamin E 1000 UNIT capsule, Take 1,000 Units by mouth daily., Disp: , Rfl:   Vitals   Vitals:   02/16/24 1002 02/16/24 1007  BP:  91/77  Pulse:  75  Resp:  15  Temp:  97.8 F (36.6 C)  TempSrc:  Oral  SpO2:  97%  Weight: 77.1 kg   Height: 5\' 8"  (1.727 m)     Body mass index is 25.84 kg/m.  Physical Exam   Constitutional: Appears well-developed and well-nourished.  Psych: Affect appropriate to situation, calm and cooperative Eyes: No scleral injection HENT: No oropharyngeal obstruction.  Post TNK he had some bleeding of the gums   MSK: no major joint deformities.  Cardiovascular: Perfusing extremities well Respiratory: Effort normal, non-labored breathing GI: Soft.  No distension. There is no tenderness.  Skin: Warm dry and intact visible skin  Neurologic Examination   Mental Status: Patient is awake, alert, oriented to person, place, month, year, and situation. Patient is able to give a clear and coherent history. No signs of aphasia or neglect Cranial Nerves: II: Visual Fields are full. Pupils are equal, round, and reactive to light.   III,IV, VI: EOMI without ptosis or diploplia.  V: Facial sensation is symmetric to temperature and light touch VII: Facial movement is symmetric.  VIII: hearing is intact to voice X: Phonation is normal XI: Shoulder shrug is symmetric. XII: tongue is midline without atrophy or fasciculations.  Motor: Tone is normal. Bulk is normal.  No pronator drift bilaterally.  Left hip flexion 4/5, right hip flexion 5/5.  Grip is weaker on the left  than the right.  Sensory: Sensation is symmetric to light touch and temperature in the arms and legs.  Although he had some subjective paresthesias on evaluation by ED provider Cerebellar: FNF and HKS are notable for dysmetria out of proportion to weakness in the left arm and leg Gait:  Deferred in acute setting and given ambulatory dysfunction reported by patient   Labs/Imaging/Neurodiagnostic studies   CBC:  Recent Labs  Lab 02-27-24 1510 2024/02/27 1523 02/16/24 1008  WBC 7.8  --  6.0  NEUTROABS 5.9  --  4.3  HGB 13.9 13.9 14.8  14.6  HCT 40.8 41.0 43.5  43.0  MCV 92.3  --  92.2  PLT 135*  --  125*   Basic Metabolic Panel:  Lab Results  Component Value Date   NA 139 02/16/2024   K 4.4 02/16/2024   CO2 23 02/27/24   GLUCOSE 217 (H) 02/16/2024   BUN 31 (H) 02/16/2024   CREATININE 2.00 (H) 02/16/2024   CALCIUM  9.6 27-Feb-2024   GFRNONAA 33 (L) 02/27/2024   Lipid Panel:  No results found for: "CHOL", "HDL", "LDLCALC", "LDLDIRECT", "TRIG", "CHOLHDL"  HgbA1c:  Lab Results  Component Value Date   HGBA1C 6.7 (H) 03/23/2023   Urine Drug Screen: No results found for: "LABOPIA", "COCAINSCRNUR", "LABBENZ", "AMPHETMU", "THCU", "LABBARB"   Alcohol  Level     Component Value Date/Time   ETH <15 02-27-24 1510   INR  Lab Results  Component Value Date   INR 1.1 2024-02-27   APTT  Lab Results  Component Value Date   APTT 30 27-Feb-2024   CT Head without contrast(Personally reviewed): No acute intracranial process.  ASPECTS 10    ASSESSMENT   Edric Fetterman is a 63 y.o. male with history of right pontine stroke in 2012, hypertension, diabetes, hyperlipidemia, left bundle branch block, right lumbar radiculopathy and right carpal tunnel syndrome who presents with left-sided weakness and incoordination which began suddenly at 0830.  On 02-27-2024, patient had a transient episode of left-sided tingling and numbness and was seen in the emergency department for possible TIA.   Patient also states that he has had about 5 episodes of left-sided tingling, numbness and weakness over the past 10 days, but they have all resolved and that he was completely at baseline when he woke up this morning.  This may have represented a capsular warning syndrome.  Today on exam, patient was noted to have significant dysmetria in the left arm and leg as well as drift in the left leg.  As symptoms were noted to be disabling, TNK was administered after discussion of risks and benefits with patient.  After TNK administration, patient did have some oozing of blood from his gums.  There was not a significant amount of bleeding, and patient states that his gums often ooze when he brushes his teeth.  RECOMMENDATIONS  # Acute ischemic stroke status post TNK - Stroke labs fasting lipid panel, UA, UDS - MRI brain 24 hours post TNK - Repeat blood vessel imaging at discretion of stroke team, deferred CTA head and neck due to low GFR; MRA head has been ordered - Frequent neuro checks - Echocardiogram - Hold for antiplatelets and Lovenox /heparin 24 hours until post TNK head imaging completed  - Risk factor modification - Telemetry monitoring - Blood pressure goal   - Post TNK for 24  hours < 180/105 - PT consult, OT consult, Speech consult, unless patient is back to baseline - Stroke team  to follow  # History of seizures - Continue Keppra  1000 nightly (atypical dosing but okay to continue given this has been working well for him; will order XR version here)  # Chronic insomnia - Continue home temazepam   # Neuropathy - Continue home gabapentin  300 twice daily ______________________________________________________________________   Patient seen by NP with MD, MD to edit note as needed. Cortney E Bucky Cardinal , MSN, AGACNP-BC [facility] See Amion for schedule and pager information 02/16/2024 11:21 AM   Attending Neurologist's note:  I personally saw this patient, gathering  history, performing a full neurologic examination, reviewing relevant labs, personally reviewing relevant imaging including head CT, recent MRA head and MRA neck, and formulated the assessment and plan, adding the note above for completeness and clarity to accurately reflect my thoughts   Baldwin Levee MD-PhD [facility] [phone]  Available 7 AM to 7 PM, outside these hours please contact Neurologist on call listed on AMION   CRITICAL CARE Performed by: Ronnette Coke   Total critical care time: 35 minutes  Critical care time was exclusive of separately billable procedures and treating other patients.  Critical care was necessary to treat or prevent imminent or life-threatening deterioration.  Critical care was time spent personally by me on the following activities: development of treatment plan with patient and/or surrogate as well as nursing, discussions with consultants, evaluation of patient's response to treatment, examination of patient, obtaining history from patient or surrogate, ordering and performing treatments and interventions, ordering and review of laboratory studies, ordering and review of radiographic studies, pulse oximetry and re-evaluation of patient's condition.

## 2024-02-16 NOTE — Progress Notes (Signed)
 PHARMACIST CODE STROKE RESPONSE  Notified to mix TNK at 1021 by Dr. Cleone Dad TNK preparation completed at 1024  TNK dose = 19 mg IV over 5 seconds.   Issues/delays encountered (if applicable): consent, clarification on last known well   Adaline Ada, PharmD PGY1 Pharmacy Resident 02/16/2024 10:28 AM

## 2024-02-16 NOTE — ED Triage Notes (Signed)
 States he was here on fri and had MRI with stoke like sx. Was discharged home with all sx. Resolving, this am at 0830 c/o weakness and was able to walk however balance was off. No pain just weakness

## 2024-02-16 NOTE — Progress Notes (Signed)
 Per Dr. Janett Medin, do MRI today (5/19) and CT tomorrow (5/20) at the 24 hr mark post-TNK. Contacted MRI and they are busy. MRI will let nurse know when patient can come down.

## 2024-02-16 NOTE — Code Documentation (Signed)
 Stroke Response Nurse Documentation Code Documentation  Gordon Christian is a 62 y.o. male arriving to [place]  via Private Vehicle on 5/19 with past medical hx of CKD, DM, Htn, CVA, seizure. On aspirin  81 mg daily and clopidogrel  75 mg daily. Code stroke was activated by ED.   Patient from home where he was LKW at 0830 and now complaining of L sided weakness and incoordination.   Stroke team at the bedside after activation by triage staff. Labs drawn and patient cleared for CT by Dr. Tamela Christian. Patient to CT with team. NIHSS 3, see documentation for details and code stroke times. Patient with left leg weakness and left limb ataxia on exam. The following imaging was completed:  CT Head. Patient is a candidate for IV Thrombolytic due to fixed neurological deficit. Patient is not a candidate for IR due to exam not consistent with LVO.   Care Plan: q15 NIHSS and vitals x 2 hours, q30 x 6 hours, q1 x 16 hours.   Bedside handoff with ED RN Gordon Christian and 4N RN Gordon Christian.    Gordon Christian  Stroke Response RN

## 2024-02-17 ENCOUNTER — Telehealth (HOSPITAL_COMMUNITY): Payer: Self-pay | Admitting: Pharmacy Technician

## 2024-02-17 ENCOUNTER — Other Ambulatory Visit (HOSPITAL_COMMUNITY): Payer: Self-pay

## 2024-02-17 ENCOUNTER — Inpatient Hospital Stay (HOSPITAL_COMMUNITY)

## 2024-02-17 DIAGNOSIS — R29700 NIHSS score 0: Secondary | ICD-10-CM

## 2024-02-17 DIAGNOSIS — I639 Cerebral infarction, unspecified: Secondary | ICD-10-CM | POA: Diagnosis not present

## 2024-02-17 LAB — CBC
HCT: 43.1 % (ref 39.0–52.0)
Hemoglobin: 14.5 g/dL (ref 13.0–17.0)
MCH: 30.2 pg (ref 26.0–34.0)
MCHC: 33.6 g/dL (ref 30.0–36.0)
MCV: 89.8 fL (ref 80.0–100.0)
Platelets: 123 10*3/uL — ABNORMAL LOW (ref 150–400)
RBC: 4.8 MIL/uL (ref 4.22–5.81)
RDW: 11.8 % (ref 11.5–15.5)
WBC: 6.4 10*3/uL (ref 4.0–10.5)
nRBC: 0 % (ref 0.0–0.2)

## 2024-02-17 LAB — COMPREHENSIVE METABOLIC PANEL WITH GFR
ALT: 29 U/L (ref 0–44)
AST: 25 U/L (ref 15–41)
Albumin: 3.6 g/dL (ref 3.5–5.0)
Alkaline Phosphatase: 34 U/L — ABNORMAL LOW (ref 38–126)
Anion gap: 8 (ref 5–15)
BUN: 26 mg/dL — ABNORMAL HIGH (ref 8–23)
CO2: 23 mmol/L (ref 22–32)
Calcium: 9 mg/dL (ref 8.9–10.3)
Chloride: 106 mmol/L (ref 98–111)
Creatinine, Ser: 1.85 mg/dL — ABNORMAL HIGH (ref 0.61–1.24)
GFR, Estimated: 40 mL/min — ABNORMAL LOW (ref >60)
Glucose, Bld: 144 mg/dL — ABNORMAL HIGH (ref 70–99)
Potassium: 4 mmol/L (ref 3.5–5.1)
Sodium: 137 mmol/L (ref 135–145)
Total Bilirubin: 0.9 mg/dL (ref 0.0–1.2)
Total Protein: 6.6 g/dL (ref 6.5–8.1)

## 2024-02-17 LAB — HEMOGLOBIN A1C
Hgb A1c MFr Bld: 6.4 % — ABNORMAL HIGH (ref 4.8–5.6)
Mean Plasma Glucose: 136.98 mg/dL

## 2024-02-17 LAB — LIPID PANEL
Cholesterol: 138 mg/dL (ref 0–200)
HDL: 41 mg/dL (ref >40)
LDL Cholesterol: 66 mg/dL (ref 0–99)
Total CHOL/HDL Ratio: 3.4 ratio
Triglycerides: 153 mg/dL — ABNORMAL HIGH (ref <150)
VLDL: 31 mg/dL (ref 0–40)

## 2024-02-17 LAB — MAGNESIUM: Magnesium: 1.7 mg/dL (ref 1.7–2.4)

## 2024-02-17 LAB — GLUCOSE, CAPILLARY
Glucose-Capillary: 131 mg/dL — ABNORMAL HIGH (ref 70–99)
Glucose-Capillary: 200 mg/dL — ABNORMAL HIGH (ref 70–99)

## 2024-02-17 MED ORDER — GABAPENTIN 300 MG PO CAPS
300.0000 mg | ORAL_CAPSULE | Freq: Two times a day (BID) | ORAL | Status: DC
  Administered 2024-02-17: 300 mg via ORAL
  Filled 2024-02-17: qty 1

## 2024-02-17 MED ORDER — TICAGRELOR 90 MG PO TABS
90.0000 mg | ORAL_TABLET | Freq: Two times a day (BID) | ORAL | Status: DC
  Administered 2024-02-17: 90 mg via ORAL
  Filled 2024-02-17: qty 1

## 2024-02-17 MED ORDER — TICAGRELOR 90 MG PO TABS
90.0000 mg | ORAL_TABLET | Freq: Two times a day (BID) | ORAL | 0 refills | Status: AC
Start: 2024-02-17 — End: 2024-03-16

## 2024-02-17 MED ORDER — ASPIRIN 81 MG PO TBEC: 81.0000 mg | DELAYED_RELEASE_TABLET | Freq: Every day | ORAL | 12 refills | Status: AC

## 2024-02-17 MED ORDER — ASPIRIN 81 MG PO TBEC
81.0000 mg | DELAYED_RELEASE_TABLET | Freq: Every day | ORAL | Status: DC
  Administered 2024-02-17: 81 mg via ORAL
  Filled 2024-02-17: qty 1

## 2024-02-17 MED ORDER — TAMSULOSIN HCL 0.4 MG PO CAPS
0.4000 mg | ORAL_CAPSULE | Freq: Every day | ORAL | Status: DC
  Administered 2024-02-17: 0.4 mg via ORAL
  Filled 2024-02-17: qty 1

## 2024-02-17 NOTE — TOC CAGE-AID Note (Signed)
 Transition of Care [facility]) - CAGE-AID Screening   Patient Details  Name: Gordon Christian MRN: [id number] Date of Birth: [birth year]  [facility] CM/SW Contact:    Trinna Kunst E Veeda Virgo, LCSW Phone Number: 02/17/2024, 11:33 AM   Clinical Narrative: Patient states he drinks alcohol  rarely. States his last drink was about 7 weeks ago. Patient denies other substance use or resources.   CAGE-AID Screening:    Have You Ever Felt You Ought to Cut Down on Your Drinking or Drug Use?: No Have People Annoyed You By Critizing Your Drinking Or Drug Use?: No Have You Felt Bad Or Guilty About Your Drinking Or Drug Use?: No Have You Ever Had a Drink or Used Drugs First Thing In The Morning to Steady Your Nerves or to Get Rid of a Hangover?: No CAGE-AID Score: 0  Substance Abuse Education Offered: Yes

## 2024-02-17 NOTE — Telephone Encounter (Signed)
 Patient Product/process development scientist completed.    The patient is insured through [employer]/ [employer]. Patient has Medicare and is not eligible for a copay card, but may be able to apply for patient assistance or Medicare RX Payment Plan (Patient Must reach out to their plan, if eligible for payment plan), if available.    Ran test claim for Brilinta 90 mg and the current 30 day co-pay is $0.00.   This test claim was processed through [facility]- copay amounts may vary at other pharmacies due to pharmacy/plan contracts, or as the patient moves through the different stages of their insurance plan.     Morgan Arab, CPHT Pharmacy Technician III Certified Patient Advocate [facility] Patient Advocate Team Direct Number: [phone]  Fax: [phone]

## 2024-02-17 NOTE — Progress Notes (Signed)
 OT Cancellation Note  Patient Details Name: Gordon Christian MRN: [id number] DOB: 1961/02/26   Cancelled Treatment:    Reason Eval/Treat Not Completed: OT screened, no needs identified, will sign off. Per PT, pt is at his baseline, no new significant unilateral weakness, cognition and vision reportedly at baseline.   Karilyn Ouch, OTR/L [facility] Acute Rehabilitation Office: [phone]   [name] 02/17/2024, 12:01 PM

## 2024-02-17 NOTE — Evaluation (Signed)
 Physical Therapy Evaluation Patient Details Name: Gordon Christian MRN: [id number] DOB: [birth year] Today's Date: 02/17/2024  History of Present Illness  Pt is a 63 y.o. male presenting 5/19 with weakness. Recent ED visit 5/16 with L weakness, but resolved and CTH negative at that time. MRI and CT from this admission also without acute findings. PMH significant for right pontine stroke in 2012, HTN, DM, HLD, left bundle branch block, right lumbar radiculopathy and right carpal tunnel syndrome   Clinical Impression  Pt in bed upon arrival of PT, agreeable to evaluation at this time. Prior to admission the pt was completely independent with all mobility, living with his spouse and frequently playing pickleball and golfing. The pt was able to complete all balance tests, mobility around the unit, stairs, and transfers without need for assistance or LOB. No discernable difference in strength in LE, and pt denies any sensory changes. Pt also denies change in vision, and no cognitive deficits noted in session. Pt educated on signs/sx of stroke and able to verbalize 3/5 before education. Pt reports family assist available at home as needed, is safe to return home when medically cleared. Pt mobilizing at supervision-independent level at this time, no further acute needs identified.         If plan is discharge home, recommend the following: Assist for transportation;Assistance with cooking/housework;Help with stairs or ramp for entrance   Can travel by private vehicle        Equipment Recommendations None recommended by PT  Recommendations for Other Services       Functional Status Assessment Patient has had a recent decline in their functional status and demonstrates the ability to make significant improvements in function in a reasonable and predictable amount of time.     Precautions / Restrictions Precautions Precautions: None Recall of Precautions/Restrictions: Intact Restrictions Weight Bearing  Restrictions Per Provider Order: No      Mobility  Bed Mobility               General bed mobility comments: pt OOB in chair at start and end of session    Transfers Overall transfer level: Modified independent Equipment used: None                    Ambulation/Gait Ambulation/Gait assistance: Supervision Gait Distance (Feet): 250 Feet Assistive device: None Gait Pattern/deviations: WFL(Within Functional Limits) Gait velocity: 1 m/s Gait velocity interpretation: >2.62 ft/sec, indicative of community ambulatory   General Gait Details: good speed and stability, even with challenge  Stairs Stairs: Yes Stairs assistance: Supervision Stair Management: No rails, Alternating pattern Number of Stairs: 12        Balance Overall balance assessment: No apparent balance deficits (not formally assessed)                               Standardized Balance Assessment Standardized Balance Assessment : Dynamic Gait Index   Dynamic Gait Index Level Surface: Normal Change in Gait Speed: Normal Gait with Horizontal Head Turns: Normal Gait with Vertical Head Turns: Normal Gait and Pivot Turn: Normal Step Over Obstacle: Mild Impairment Step Around Obstacles: Normal Steps: Normal Total Score: 23       Pertinent Vitals/Pain Pain Assessment Pain Assessment: No/denies pain    Home Living Family/patient expects to be discharged to:: Private residence Living Arrangements: Spouse/significant other Available Help at Discharge: Family;Available PRN/intermittently (spouse works but is home often) Type of Home: House Home Access: Stairs  to enter Entrance Stairs-Rails: None Entrance Stairs-Number of Steps: 2   Home Layout: One level Home Equipment: None Additional Comments: wife works for [employer]    Prior Function Prior Level of Function : Independent/Modified Independent;Driving             Mobility Comments: independent, active ADLs Comments:  independent     Extremity/Trunk Assessment   Upper Extremity Assessment Upper Extremity Assessment: Overall WFL for tasks assessed    Lower Extremity Assessment Lower Extremity Assessment: Overall WFL for tasks assessed (5/5 to MMT, no difference in sensation)    Cervical / Trunk Assessment Cervical / Trunk Assessment: Normal  Communication   Communication Communication: No apparent difficulties    Cognition Arousal: Alert Behavior During Therapy: WFL for tasks assessed/performed   PT - Cognitive impairments: No apparent impairments                         Following commands: Intact       Cueing Cueing Techniques: Verbal cues            Assessment/Plan    PT Assessment Patient does not need any further PT services         PT Goals (Current goals can be found in the Care Plan section)  Acute Rehab PT Goals Patient Stated Goal: return home and to playing pickleball PT Goal Formulation: All assessment and education complete, DC therapy Time For Goal Achievement: 02/24/24 Potential to Achieve Goals: Good     AM-PAC PT "6 Clicks" Mobility  Outcome Measure Help needed turning from your back to your side while in a flat bed without using bedrails?: None Help needed moving from lying on your back to sitting on the side of a flat bed without using bedrails?: None Help needed moving to and from a bed to a chair (including a wheelchair)?: None Help needed standing up from a chair using your arms (e.g., wheelchair or bedside chair)?: None Help needed to walk in hospital room?: None Help needed climbing 3-5 steps with a railing? : A Little 6 Click Score: 23    End of Session Equipment Utilized During Treatment: Gait belt Activity Tolerance: Patient tolerated treatment well Patient left: in chair;with call bell/phone within reach Nurse Communication: Mobility status PT Visit Diagnosis: Unsteadiness on feet (R26.81);Hemiplegia and hemiparesis Hemiplegia -  Right/Left: Left Hemiplegia - dominant/non-dominant: Non-dominant Hemiplegia - caused by: Unspecified    Time: 5621-3086 PT Time Calculation (min) (ACUTE ONLY): 16 min   Charges:   PT Evaluation $PT Eval Low Complexity: 1 Low   PT General Charges $$ ACUTE PT VISIT: 1 Visit         Barnabas Booth, PT, DPT   Acute Rehabilitation Department Office [phone] Secure Chat Communication Preferred  Lona Rist 02/17/2024, 12:31 PM

## 2024-02-17 NOTE — Discharge Summary (Addendum)
 Stroke Discharge Summary  Patient ID: Leomar Westberg   MRN: [id number]      DOB: [birth year]  Date of Admission: 02/16/2024 Date of Discharge: 02/17/2024  Attending Physician:  Stroke, Md, MD Patient's PCP:  Carol Chroman, MD  DISCHARGE PRIMARY DIAGNOSIS:  Acute I right subcortical stroke aborted by TNK versus worsening of old deficits  Patient Active Problem List   Diagnosis Date Noted   Stroke determined by clinical assessment (HCC) 02/16/2024   Acute ischemic stroke (HCC) 02/16/2024   Severe sepsis (HCC) 03/23/2023   Complicated UTI (urinary tract infection) 03/23/2023   Controlled type 2 diabetes mellitus without complication, without long-term current use of insulin  (HCC) 03/23/2023   AKI (acute kidney injury) (HCC) 03/23/2023   High anion gap metabolic acidosis 03/23/2023   CKD stage 3b, GFR 30-44 ml/min (HCC) 03/23/2023   Essential hypertension 03/23/2023   Dyslipidemia 03/23/2023   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 03/23/2023   History of seizure 03/23/2023   Chronic pain 03/23/2023   Chronic idiopathic thrombocytopenia (HCC) 03/23/2023   History of renal stone 09/18/2007     Allergies as of 02/17/2024   No Known Allergies      Medication List     STOP taking these medications    aspirin  325 MG tablet Replaced by: aspirin  EC 81 MG tablet   clopidogrel  75 MG tablet Commonly known as: PLAVIX    meloxicam 15 MG tablet Commonly known as: MOBIC   traZODone  100 MG tablet Commonly known as: DESYREL        TAKE these medications    acetaminophen  650 MG CR tablet Commonly known as: TYLENOL  Take 1,300 mg by mouth every 8 (eight) hours as needed for pain.   aspirin  EC 81 MG tablet Take 1 tablet (81 mg total) by mouth daily. Swallow whole. Replaces: aspirin  325 MG tablet   gabapentin  300 MG capsule Commonly known as: NEURONTIN  Take 1 capsule (300 mg total) by mouth 3 (three) times daily. What changed: when  to take this   levETIRAcetam  1000 MG tablet Commonly known as: KEPPRA  Take 1 tablet (1,000 mg total) by mouth every evening.   lisinopril -hydrochlorothiazide  20-12.5 MG tablet Commonly known as: ZESTORETIC  Take 1 tablet by mouth daily. What changed: when to take this   LUBRICATING EYE DROPS OP Place 1 drop into both eyes daily as needed (dry eyes).   OneTouch Delica Plus Lancet33G Misc Use to check blood sugar three times daily What changed: Another medication with the same name was removed. Continue taking this medication, and follow the directions you see here.   OneTouch Verio test strip Generic drug: glucose blood USE TO CHECK BLOOD SUGAR 3 TIMES DAILY What changed: Another medication with the same name was removed. Continue taking this medication, and follow the directions you see here.   Ozempic  (1 MG/DOSE) 4 MG/3ML Sopn Generic drug: Semaglutide  (1 MG/DOSE) Inject 1 mg into the skin once a week.   rosuvastatin  20 MG tablet Commonly known as: CRESTOR  Take 1 tablet (20 mg total) by mouth at bedtime.   tamsulosin  0.4 MG Caps capsule Commonly known as: FLOMAX  Take 0.4 mg by mouth at bedtime.   temazepam  15 MG capsule Commonly known as: RESTORIL  Take 1 capsule (15 mg total) by mouth at bedtime as needed for sleep. What changed: when to take this   ticagrelor 90 MG Tabs tablet Commonly known as: BRILINTA Take 1 tablet (90 mg total) by mouth 2 (two) times daily  for 28 days.   VITAMIN B-12 PO Take 1 tablet by mouth daily.   Vitamin D  (Ergocalciferol ) 1.25 MG (50000 UNIT) Caps capsule Commonly known as: DRISDOL  Take 1 capsule (50,000 Units total) by mouth once a week.   vitamin E 1000 UNIT capsule Take 1,000 Units by mouth daily.        LABORATORY STUDIES CBC    Component Value Date/Time   WBC 6.4 02/17/2024 0648   RBC 4.80 02/17/2024 0648   HGB 14.5 02/17/2024 0648   HCT 43.1 02/17/2024 0648   PLT 123 (L) 02/17/2024 0648   MCV 89.8 02/17/2024 0648    MCH 30.2 02/17/2024 0648   MCHC 33.6 02/17/2024 0648   RDW 11.8 02/17/2024 0648   LYMPHSABS 1.2 02/16/2024 1008   MONOABS 0.4 02/16/2024 1008   EOSABS 0.1 02/16/2024 1008   BASOSABS 0.0 02/16/2024 1008   CMP    Component Value Date/Time   NA 137 02/17/2024 0648   K 4.0 02/17/2024 0648   CL 106 02/17/2024 0648   CO2 23 02/17/2024 0648   GLUCOSE 144 (H) 02/17/2024 0648   BUN 26 (H) 02/17/2024 0648   CREATININE 1.85 (H) 02/17/2024 0648   CALCIUM  9.0 02/17/2024 0648   PROT 6.6 02/17/2024 0648   ALBUMIN 3.6 02/17/2024 0648   AST 25 02/17/2024 0648   ALT 29 02/17/2024 0648   ALKPHOS 34 (L) 02/17/2024 0648   BILITOT 0.9 02/17/2024 0648   GFRNONAA 40 (L) 02/17/2024 0648   COAGS Lab Results  Component Value Date   INR 1.1 02/16/2024   INR 1.1 02/13/2024   INR 1.4 (H) 03/23/2023   Lipid Panel    Component Value Date/Time   CHOL 138 02/17/2024 0648   TRIG 153 (H) 02/17/2024 0648   HDL 41 02/17/2024 0648   CHOLHDL 3.4 02/17/2024 0648   VLDL 31 02/17/2024 0648   LDLCALC 66 02/17/2024 0648   HgbA1C  Lab Results  Component Value Date   HGBA1C 6.4 (H) 02/17/2024   Alcohol  Level    Component Value Date/Time   [facility] <15 02/16/2024 1008     SIGNIFICANT DIAGNOSTIC STUDIES MR BRAIN WO CONTRAST Result Date: 02/17/2024 CLINICAL DATA:  Follow-up examination for stroke. EXAM: MRI HEAD WITHOUT CONTRAST MRA HEAD WITHOUT CONTRAST TECHNIQUE: Multiplanar, multi-echo pulse sequences of the brain and surrounding structures were acquired without intravenous contrast. Angiographic images of the Circle of Willis were acquired using MRA technique without intravenous contrast. COMPARISON:  Prior studies from 02/13/2024 FINDINGS: MRI HEAD FINDINGS Brain: Cerebral volume within normal limits. Mild hazy and patchy T2/FLAIR hyperintensity seen involving the supratentorial cerebral white matter, most likely related chronic microvascular ischemic disease, minor for age. Remote lacunar infarct present at  the right ventral pons. No evidence for acute or subacute ischemia. Gray-white matter differentiation maintained. No acute or chronic intracranial blood products. No mass lesion, midline shift, or mass effect. No hydrocephalus or extra-axial fluid collection. Pituitary gland within normal limits. Vascular: Major intracranial vascular flow voids are maintained. Skull and upper cervical spine: Craniocervical junction within normal limits. Bone marrow signal intensity normal. No scalp soft tissue abnormality. Sinuses/Orbits: Globes and orbital soft tissues within normal limits. Paranasal sinuses are largely clear. Trace left mastoid effusion noted, of doubtful significance. Other: None. MRA HEAD FINDINGS Anterior circulation: Both internal carotid arteries are widely patent through the siphons without stenosis or other abnormality. A1 segments patent bilaterally. Left A1 hypoplastic, accounting for the slightly diminutive left ICA is compared to the right. Tiny 2 mm outpouching extending  posteriorly from the anterior communicating artery complex, suspicious for a small aneurysm (series 5, image 98). Both ACAs patent to their distal aspects without stenosis. No M1 stenosis or occlusion. No proximal MCA branch occlusion or high-grade stenosis. Distal MCA branches perfused and symmetric. Posterior circulation: Visualized distal V4 segments patent without stenosis. Neither PICA origin visualized on this exam. Basilar mildly diminutive but patent without stenosis. Superior cerebral arteries patent bilaterally. Fetal type origin of the PCAs bilaterally. Both PCAs patent to their distal aspects without stenosis. Anatomic variants: Fetal type origin of the PCAs with overall diminutive vertebrobasilar system. IMPRESSION: MRI HEAD: 1. No acute intracranial abnormality. 2. Remote lacunar infarct at the right ventral pons with underlying minor chronic microvascular ischemic disease. MRA HEAD: 1. Negative intracranial MRA for large  vessel occlusion or other emergent finding. No hemodynamically significant or correctable stenosis. 2. 2 mm outpouching extending posteriorly from the anterior communicating artery complex, suspicious for a small aneurysm. Attention at follow-up recommended. Electronically Signed   By: Virgia Griffins M.D.   On: 02/17/2024 05:52   MR ANGIO HEAD WO CONTRAST Result Date: 02/17/2024 CLINICAL DATA:  Follow-up examination for stroke. EXAM: MRI HEAD WITHOUT CONTRAST MRA HEAD WITHOUT CONTRAST TECHNIQUE: Multiplanar, multi-echo pulse sequences of the brain and surrounding structures were acquired without intravenous contrast. Angiographic images of the Circle of Willis were acquired using MRA technique without intravenous contrast. COMPARISON:  Prior studies from 02/13/2024 FINDINGS: MRI HEAD FINDINGS Brain: Cerebral volume within normal limits. Mild hazy and patchy T2/FLAIR hyperintensity seen involving the supratentorial cerebral white matter, most likely related chronic microvascular ischemic disease, minor for age. Remote lacunar infarct present at the right ventral pons. No evidence for acute or subacute ischemia. Gray-white matter differentiation maintained. No acute or chronic intracranial blood products. No mass lesion, midline shift, or mass effect. No hydrocephalus or extra-axial fluid collection. Pituitary gland within normal limits. Vascular: Major intracranial vascular flow voids are maintained. Skull and upper cervical spine: Craniocervical junction within normal limits. Bone marrow signal intensity normal. No scalp soft tissue abnormality. Sinuses/Orbits: Globes and orbital soft tissues within normal limits. Paranasal sinuses are largely clear. Trace left mastoid effusion noted, of doubtful significance. Other: None. MRA HEAD FINDINGS Anterior circulation: Both internal carotid arteries are widely patent through the siphons without stenosis or other abnormality. A1 segments patent bilaterally. Left A1  hypoplastic, accounting for the slightly diminutive left ICA is compared to the right. Tiny 2 mm outpouching extending posteriorly from the anterior communicating artery complex, suspicious for a small aneurysm (series 5, image 98). Both ACAs patent to their distal aspects without stenosis. No M1 stenosis or occlusion. No proximal MCA branch occlusion or high-grade stenosis. Distal MCA branches perfused and symmetric. Posterior circulation: Visualized distal V4 segments patent without stenosis. Neither PICA origin visualized on this exam. Basilar mildly diminutive but patent without stenosis. Superior cerebral arteries patent bilaterally. Fetal type origin of the PCAs bilaterally. Both PCAs patent to their distal aspects without stenosis. Anatomic variants: Fetal type origin of the PCAs with overall diminutive vertebrobasilar system. IMPRESSION: MRI HEAD: 1. No acute intracranial abnormality. 2. Remote lacunar infarct at the right ventral pons with underlying minor chronic microvascular ischemic disease. MRA HEAD: 1. Negative intracranial MRA for large vessel occlusion or other emergent finding. No hemodynamically significant or correctable stenosis. 2. 2 mm outpouching extending posteriorly from the anterior communicating artery complex, suspicious for a small aneurysm. Attention at follow-up recommended. Electronically Signed   By: Virgia Griffins M.D.   On: 02/17/2024 05:52  ECHOCARDIOGRAM COMPLETE Result Date: 02/16/2024    ECHOCARDIOGRAM REPORT   Patient Name:   CANTON YEARBY Date of Exam: 02/16/2024 Medical Rec #:  [id number]  Height:       68.0 in Accession #:    [id number] Weight:       170.0 lb Date of Birth:  29-May-1961  BSA:          1.907 m Patient Age:    63 years   BP:           110/75 mmHg Patient Gender: M          HR:           62 bpm. Exam Location:  Inpatient Procedure: 2D Echo, Color Doppler and Cardiac Doppler (Both Spectral and Color            Flow Doppler were utilized during procedure).  Indications:    Stroke i63.9  History:        Patient has no prior history of Echocardiogram examinations.                 Risk Factors:Hypertension, Diabetes and Dyslipidemia.  Sonographer:    Sherline Distel Senior RDCS Referring Phys: [id number] CORTNEY E DE [place] IMPRESSIONS  1. Left ventricular ejection fraction, by estimation, is 45 to 50%. The left ventricle has mildly decreased function. The left ventricle demonstrates global hypokinesis. There is mild left ventricular hypertrophy of the basal-septal segment. Left ventricular diastolic parameters are consistent with Grade I diastolic dysfunction (impaired relaxation).  2. Right ventricular systolic function is normal. The right ventricular size is normal.  3. Left atrial size was mildly dilated.  4. The mitral valve is normal in structure. Trivial mitral valve regurgitation. No evidence of mitral stenosis.  5. The aortic valve is tricuspid. Aortic valve regurgitation is not visualized. No aortic stenosis is present.  6. Aortic dilatation noted. There is mild dilatation of the aortic root, measuring 41 mm. Comparison(s): No prior Echocardiogram. FINDINGS  Left Ventricle: Left ventricular ejection fraction, by estimation, is 45 to 50%. The left ventricle has mildly decreased function. The left ventricle demonstrates global hypokinesis. The left ventricular internal cavity size was normal in size. There is  mild left ventricular hypertrophy of the basal-septal segment. Abnormal (paradoxical) septal motion, consistent with left bundle branch block. Left ventricular diastolic parameters are consistent with Grade I diastolic dysfunction (impaired relaxation). Right Ventricle: The right ventricular size is normal. Right ventricular systolic function is normal. Left Atrium: Left atrial size was mildly dilated. Right Atrium: Right atrial size was normal in size. Pericardium: There is no evidence of pericardial effusion. Mitral Valve: The mitral valve is normal in structure.  Trivial mitral valve regurgitation. No evidence of mitral valve stenosis. Tricuspid Valve: The tricuspid valve is normal in structure. Tricuspid valve regurgitation is trivial. No evidence of tricuspid stenosis. Aortic Valve: The aortic valve is tricuspid. Aortic valve regurgitation is not visualized. No aortic stenosis is present. Pulmonic Valve: The pulmonic valve was not well visualized. Pulmonic valve regurgitation is not visualized. No evidence of pulmonic stenosis. Aorta: Aortic dilatation noted. There is mild dilatation of the aortic root, measuring 41 mm. Venous: The inferior vena cava was not well visualized. IAS/Shunts: The interatrial septum was not well visualized.  LEFT VENTRICLE PLAX 2D LVIDd:         4.20 cm     Diastology LVIDs:         3.60 cm     LV e' medial:    5.11  cm/s LV PW:         0.70 cm     LV E/e' medial:  9.3 LV IVS:        1.50 cm     LV e' lateral:   6.09 cm/s LVOT diam:     2.50 cm     LV E/e' lateral: 7.8 LV SV:         98 LV SV Index:   51 LVOT Area:     4.91 cm  LV Volumes (MOD) LV vol d, MOD A2C: 88.7 ml LV vol d, MOD A4C: 79.9 ml LV vol s, MOD A2C: 47.5 ml LV vol s, MOD A4C: 45.1 ml LV SV MOD A2C:     41.2 ml LV SV MOD A4C:     79.9 ml LV SV MOD BP:      36.6 ml RIGHT VENTRICLE RV S prime:     11.70 cm/s TAPSE (M-mode): 2.4 cm LEFT ATRIUM             Index        RIGHT ATRIUM           Index LA diam:        3.70 cm 1.94 cm/m   RA Area:     21.70 cm LA Vol (A2C):   68.5 ml 35.92 ml/m  RA Volume:   62.10 ml  32.56 ml/m LA Vol (A4C):   61.9 ml 32.46 ml/m LA Biplane Vol: 66.5 ml 34.87 ml/m  AORTIC VALVE LVOT Vmax:   101.00 cm/s LVOT Vmean:  74.200 cm/s LVOT VTI:    0.200 m  AORTA Ao Root diam: 4.10 cm MITRAL VALVE               TRICUSPID VALVE MV Area (PHT): 2.31 cm    TR Peak grad:   11.6 mmHg MV Decel Time: 329 msec    TR Vmax:        170.00 cm/s MV E velocity: 47.40 cm/s MV A velocity: 64.30 cm/s  SHUNTS MV E/A ratio:  0.74        Systemic VTI:  0.20 m                             Systemic Diam: 2.50 cm Alexandria Angel MD Electronically signed by Alexandria Angel MD Signature Date/Time: 02/16/2024/3:04:04 PM    Final    CT HEAD CODE STROKE WO CONTRAST Result Date: 02/16/2024 CLINICAL DATA:  Code stroke. Provided history: Neuro deficit, acute, stroke suspected. Left-sided weakness. EXAM: CT HEAD WITHOUT CONTRAST TECHNIQUE: Contiguous axial images were obtained from the base of the skull through the vertex without intravenous contrast. RADIATION DOSE REDUCTION: This exam was performed according to the departmental dose-optimization program which includes automated exposure control, adjustment of the mA and/or kV according to patient size and/or use of iterative reconstruction technique. COMPARISON:  Brain MRI 02/13/2024. FINDINGS: Brain: No age-advanced or lobar predominant cerebral atrophy. There is no acute intracranial hemorrhage. No demarcated cortical infarct. No extra-axial fluid collection. No evidence of an intracranial mass. No midline shift. Vascular: No hyperdense vessel.  Atherosclerotic calcifications. Skull: No calvarial fracture or aggressive osseous lesion. Sinuses/Orbits: No mass or acute finding within the imaged orbits. Small-volume secretions within a posterior right ethmoid air cell. ASPECTS [facility] Stroke Program Early CT Score) - Ganglionic level infarction (caudate, lentiform nuclei, internal capsule, insula, M1-M3 cortex): 7 - Supraganglionic infarction (M4-M6 cortex): 3 Total score (0-10 with 10 being  normal): 10 No acute intracranial finding. These results were communicated to Dr. Cleone Dad at 10:23 amon 5/19/2025by text page via the [facility] messaging system. IMPRESSION: No acute intracranial finding. ASPECTS is 10. Electronically Signed   By: Bascom Lily D.O.   On: 02/16/2024 10:24   MR ANGIO HEAD WO CONTRAST Result Date: 02/13/2024 CLINICAL DATA:  Neuro deficit, acute, stroke suspected EXAM: MRI HEAD WITHOUT CONTRAST MRA HEAD WITHOUT CONTRAST MRA OF THE NECK  WITHOUT AND WITH CONTRAST TECHNIQUE: Multiplanar, multi-echo pulse sequences of the brain and surrounding structures were acquired without intravenous contrast. Angiographic images of the Circle of Willis were acquired using MRA technique without intravenous contrast. Angiographic images of the neck were acquired using MRA technique without and with intravenous contrast. Carotid stenosis measurements (when applicable) are obtained utilizing NASCET criteria, using the distal internal carotid diameter as the denominator. CONTRAST:  8mL GADAVIST  GADOBUTROL  1 MMOL/ML IV SOLN COMPARISON:  CT head Feb 13, 2024. FINDINGS: MR HEAD FINDINGS Brain: No acute infarction, hemorrhage, hydrocephalus, extra-axial collection or mass lesion. Vascular: See below. Skull and upper cervical spine: Normal marrow signal. Sinuses/Orbits: Clear sinuses.  No acute orbital findings. Other: Trace left mastoid effusion. MRA HEAD FINDINGS Anterior circulation: Bilateral intracranial ICAs, MCAs, and ACAs are patent without proximal hemodynamically significant stenosis. No aneurysm identified. Hypoplastic left A1 ACA. Posterior circulation: Bilateral intradural vertebral arteries, basilar artery and bilateral posterior cerebral arteries are patent without proximal hemodynamically significant stenosis. Bilateral fetal type PCAs, anatomic variant. No aneurysm identified. MRA NECK FINDINGS Aortic arch: Great vessel origins are patent without significant stenosis. Right carotid system: Patent without significant stenosis. Left carotid system: Patent without significant stenosis. Vertebral arteries: Patent without significant stenosis. Codominant. IMPRESSION: 1. No evidence of acute intracranial abnormality. 2. No large vessel occlusion or proximal hemodynamically significant stenosis. Electronically Signed   By: Stevenson Elbe M.D.   On: 02/13/2024 21:47   MR Angiogram Neck W or Wo Contrast Result Date: 02/13/2024 CLINICAL DATA:  Neuro deficit,  acute, stroke suspected EXAM: MRI HEAD WITHOUT CONTRAST MRA HEAD WITHOUT CONTRAST MRA OF THE NECK WITHOUT AND WITH CONTRAST TECHNIQUE: Multiplanar, multi-echo pulse sequences of the brain and surrounding structures were acquired without intravenous contrast. Angiographic images of the Circle of Willis were acquired using MRA technique without intravenous contrast. Angiographic images of the neck were acquired using MRA technique without and with intravenous contrast. Carotid stenosis measurements (when applicable) are obtained utilizing NASCET criteria, using the distal internal carotid diameter as the denominator. CONTRAST:  8mL GADAVIST  GADOBUTROL  1 MMOL/ML IV SOLN COMPARISON:  CT head Feb 13, 2024. FINDINGS: MR HEAD FINDINGS Brain: No acute infarction, hemorrhage, hydrocephalus, extra-axial collection or mass lesion. Vascular: See below. Skull and upper cervical spine: Normal marrow signal. Sinuses/Orbits: Clear sinuses.  No acute orbital findings. Other: Trace left mastoid effusion. MRA HEAD FINDINGS Anterior circulation: Bilateral intracranial ICAs, MCAs, and ACAs are patent without proximal hemodynamically significant stenosis. No aneurysm identified. Hypoplastic left A1 ACA. Posterior circulation: Bilateral intradural vertebral arteries, basilar artery and bilateral posterior cerebral arteries are patent without proximal hemodynamically significant stenosis. Bilateral fetal type PCAs, anatomic variant. No aneurysm identified. MRA NECK FINDINGS Aortic arch: Great vessel origins are patent without significant stenosis. Right carotid system: Patent without significant stenosis. Left carotid system: Patent without significant stenosis. Vertebral arteries: Patent without significant stenosis. Codominant. IMPRESSION: 1. No evidence of acute intracranial abnormality. 2. No large vessel occlusion or proximal hemodynamically significant stenosis. Electronically Signed   By: Stevenson Elbe M.D.   On: 02/13/2024  21:47   MR BRAIN WO CONTRAST Result Date: 02/13/2024 CLINICAL DATA:  Neuro deficit, acute, stroke suspected EXAM: MRI HEAD WITHOUT CONTRAST MRA HEAD WITHOUT CONTRAST MRA OF THE NECK WITHOUT AND WITH CONTRAST TECHNIQUE: Multiplanar, multi-echo pulse sequences of the brain and surrounding structures were acquired without intravenous contrast. Angiographic images of the Circle of Willis were acquired using MRA technique without intravenous contrast. Angiographic images of the neck were acquired using MRA technique without and with intravenous contrast. Carotid stenosis measurements (when applicable) are obtained utilizing NASCET criteria, using the distal internal carotid diameter as the denominator. CONTRAST:  8mL GADAVIST  GADOBUTROL  1 MMOL/ML IV SOLN COMPARISON:  CT head Feb 13, 2024. FINDINGS: MR HEAD FINDINGS Brain: No acute infarction, hemorrhage, hydrocephalus, extra-axial collection or mass lesion. Vascular: See below. Skull and upper cervical spine: Normal marrow signal. Sinuses/Orbits: Clear sinuses.  No acute orbital findings. Other: Trace left mastoid effusion. MRA HEAD FINDINGS Anterior circulation: Bilateral intracranial ICAs, MCAs, and ACAs are patent without proximal hemodynamically significant stenosis. No aneurysm identified. Hypoplastic left A1 ACA. Posterior circulation: Bilateral intradural vertebral arteries, basilar artery and bilateral posterior cerebral arteries are patent without proximal hemodynamically significant stenosis. Bilateral fetal type PCAs, anatomic variant. No aneurysm identified. MRA NECK FINDINGS Aortic arch: Great vessel origins are patent without significant stenosis. Right carotid system: Patent without significant stenosis. Left carotid system: Patent without significant stenosis. Vertebral arteries: Patent without significant stenosis. Codominant. IMPRESSION: 1. No evidence of acute intracranial abnormality. 2. No large vessel occlusion or proximal hemodynamically  significant stenosis. Electronically Signed   By: Stevenson Elbe M.D.   On: 02/13/2024 21:47   CT HEAD WO CONTRAST Result Date: 02/13/2024 CLINICAL DATA:  Neuro deficit, concern for stroke, numbness. EXAM: CT HEAD WITHOUT CONTRAST TECHNIQUE: Contiguous axial images were obtained from the base of the skull through the vertex without intravenous contrast. RADIATION DOSE REDUCTION: This exam was performed according to the departmental dose-optimization program which includes automated exposure control, adjustment of the mA and/or kV according to patient size and/or use of iterative reconstruction technique. COMPARISON:  None Available. FINDINGS: Brain: No acute intracranial hemorrhage. No CT evidence of acute infarct. No edema, mass effect, or midline shift. The basilar cisterns are patent. Ventricles: The ventricles are normal. Vascular: No hyperdense vessel or unexpected calcification. Skull: No acute or aggressive finding. Orbits: Orbits are symmetric. Sinuses: The visualized paranasal sinuses are clear. Other: Mastoid air cells are clear. IMPRESSION: No CT evidence of acute intracranial abnormality. Electronically Signed   By: Denny Flack M.D.   On: 02/13/2024 15:59       HISTORY OF PRESENT ILLNESS 63 y.o.  with past medical history significant for prior right pontine stroke in 2012 with left-sided numbness/weakness without residual deficits, hypertension, type 2 diabetes, hyperlipidemia, left bundle branch block, right lumbar radiculopathy and right carpal tunnel syndrome who presented presented on 02/13/2024 with transient episode of left-sided tingling and numbness that started in his left leg with progression to his arm after about 45 minutes with the entire event lasting approximately 1 hour. He was given TNK 5/19 and was admitted for full stroke work up. Denies headache during the episode or currently. No vision or speech changes during the episode or currently.  Patient stated that he felt back  to his baseline about an hour after receiving the TNK medication and continued to have no complaints throughout the night, into today. He made a quick and consistent recovery from his presentation at admission.   HOSPITAL COURSE Acute right subcortical infarct,  aborted by TNK Administration versus worsening of old deficit Etiology:  small vessel disease Code Stroke CT head No acute abnormality. ASPECTS 10.    Repeat CT Head 5/20: Stable.   MRI   No acute intracranial abnormality. Remote lacunar infarct at the right ventral pons with underlying minor chronic microvascular ischemic disease. MRA   Negative intracranial MRA for large vessel occlusion or other emergent finding. No hemodynamically significant or correctable stenosis.  2 mm outpouching extending posteriorly from the anterior communicating artery complex, suspicious for a small aneurysm.  2D Echo: EF 45-50%, Mildly dilated LA, Mild LVH LDL 66 HgbA1c 6.4 VTE prophylaxis - ambulatory    Diet   Diet regular Room service appropriate? Yes; Fluid consistency: Thin   aspirin  325 mg daily and clopidogrel  75 mg daily prior to admission, now on aspirin  81 mg daily and Brilinta (ticagrelor) 90 mg bid for 4 weeks, then ASA alone.  Therapy recommendations:  no follow-ups needed Disposition:  home  Hypertension Home meds:  Zestoretic  Stable SBP goal <180, gradually normalize Long-term BP goal normotensive  Hyperlipidemia Home meds:  Crestor  20mg , resumed in hospital LDL 66, goal < 70 Continue statin at discharge  Diabetes type II Controlled Home meds:  Semaglutide  HgbA1c 6.4, goal < 7.0 CBGs Recent Labs    02/16/24 2246 02/17/24 0757 02/17/24 1133  GLUCAP 109* 131* 200*    SSI  Other Stroke Risk Factors Advanced Age >/= 57  Family hx stroke (brother)    DISCHARGE EXAM  PHYSICAL EXAM General:  Alert, well-nourished, well-developed patient in no acute distress Psych:  Mood and affect appropriate for  situation CV: Regular rate and rhythm on monitor Respiratory:  Regular, unlabored respirations on room air Skin: R eye bruising   NEURO:  Mental Status: AA&Ox3  Speech/Language: speech is without dysarthria or aphasia.  Naming, repetition, fluency, and comprehension intact.  Cranial Nerves:  II: PERRL. Visual fields full.  III, IV, VI: EOMI. Eyelids elevate symmetrically.  V: Sensation is intact to light touch and symmetrical to face.  VII: Smile is symmetrical.  VIII: hearing intact to voice. IX, X: Palate elevates symmetrically. Phonation is normal.  [id number] shrug 5/5. XII: tongue is midline without fasciculations. Motor: 5/5 strength to all muscle groups tested.  Tone: is normal and bulk is normal Sensation- Intact to light touch bilaterally. Extinction absent to light touch to DSS. Coordination: FTN intact bilaterally, HKS: no ataxia in BLE.No drift.  Gait- deferred  Discharge NIH: 1a Level of Conscious.: 0 1b LOC Questions: 0 1c LOC Commands: 0 2 Best Gaze: 0 3 Visual: 0 4 Facial Palsy: 0 5a Motor Arm - left: 0 5b Motor Arm - Right: 0 6a Motor Leg - Left: 0 6b Motor Leg - Right: 0 7 Limb Ataxia: 0 8 Sensory: 0 9 Best Language: 0 10 Dysarthria: 0 11 Extinct. and Inatten.: 0 TOTAL: 0   Discharge Diet       Diet   Diet regular Room service appropriate? Yes; Fluid consistency: Thin   liquids  DISCHARGE PLAN Disposition: Home aspirin  81 mg daily and Brilinta (ticagrelor) 90 mg bid for secondary stroke prevention for 4 weeks then aspirin  81 mg daily alone. Ongoing stroke risk factor control by Primary Care Physician at time of discharge Follow-up PCP Carol Chroman, MD in 2 weeks. Follow-up with current outpatient neurologist, Dr. Christia Cowboy  35 minutes were spent preparing discharge.   Pt seen by Neuro NP/APP and later by MD. Note/plan to be edited by MD as needed.  Audrene Lease, DNP, AGACNP-BC [facility] Please use AMION for contact  information & EPIC for messaging. I have personally obtained history,examined this patient, reviewed notes, independently viewed imaging studies, participated in medical decision making and plan of care.ROS completed by me personally and pertinent positives fully documented  I have made any additions or clarifications directly to the above note. Agree with note above.    Ardella Beaver, MD Medical Director [facility] Pager: [phone] 02/17/2024 6:45 PM

## 2024-02-19 ENCOUNTER — Other Ambulatory Visit (HOSPITAL_COMMUNITY): Payer: Self-pay

## 2024-02-20 ENCOUNTER — Other Ambulatory Visit: Payer: Self-pay

## 2024-02-24 ENCOUNTER — Other Ambulatory Visit (HOSPITAL_COMMUNITY): Payer: Self-pay

## 2024-03-18 ENCOUNTER — Other Ambulatory Visit (HOSPITAL_COMMUNITY): Payer: Self-pay

## 2024-03-18 ENCOUNTER — Other Ambulatory Visit: Payer: Self-pay

## 2024-03-20 ENCOUNTER — Other Ambulatory Visit (HOSPITAL_COMMUNITY): Payer: Self-pay

## 2024-03-20 MED ORDER — OZEMPIC (1 MG/DOSE) 4 MG/3ML ~~LOC~~ SOPN
1.0000 mg | PEN_INJECTOR | SUBCUTANEOUS | 3 refills | Status: AC
  Filled 2024-03-20: qty 3, 28d supply, fill #0
  Filled 2024-04-12: qty 3, 28d supply, fill #1
  Filled 2024-05-10: qty 3, 28d supply, fill #2
  Filled 2024-06-21: qty 3, 28d supply, fill #3

## 2024-03-24 ENCOUNTER — Other Ambulatory Visit (HOSPITAL_COMMUNITY): Payer: Self-pay

## 2024-03-26 ENCOUNTER — Other Ambulatory Visit (HOSPITAL_COMMUNITY): Payer: Self-pay

## 2024-03-26 ENCOUNTER — Other Ambulatory Visit: Payer: Self-pay

## 2024-03-26 MED ORDER — VITAMIN D (ERGOCALCIFEROL) 1.25 MG (50000 UNIT) PO CAPS
50000.0000 [IU] | ORAL_CAPSULE | ORAL | 0 refills | Status: DC
  Filled 2024-03-26: qty 13, 91d supply, fill #0

## 2024-03-31 ENCOUNTER — Other Ambulatory Visit (HOSPITAL_COMMUNITY): Payer: Self-pay

## 2024-04-13 ENCOUNTER — Other Ambulatory Visit (HOSPITAL_COMMUNITY): Payer: Self-pay

## 2024-04-13 MED ORDER — ROSUVASTATIN CALCIUM 20 MG PO TABS
20.0000 mg | ORAL_TABLET | Freq: Every day | ORAL | 0 refills | Status: DC
  Filled 2024-04-13: qty 90, 90d supply, fill #0

## 2024-04-13 MED ORDER — GABAPENTIN 300 MG PO CAPS
300.0000 mg | ORAL_CAPSULE | Freq: Three times a day (TID) | ORAL | 0 refills | Status: DC
  Filled 2024-04-13: qty 270, 90d supply, fill #0

## 2024-05-13 ENCOUNTER — Other Ambulatory Visit: Payer: Self-pay

## 2024-05-13 ENCOUNTER — Other Ambulatory Visit (HOSPITAL_COMMUNITY): Payer: Self-pay

## 2024-05-13 MED ORDER — TEMAZEPAM 15 MG PO CAPS
15.0000 mg | ORAL_CAPSULE | Freq: Every evening | ORAL | 1 refills | Status: AC | PRN
  Filled 2024-05-13 – 2024-07-21 (×5): qty 90, 90d supply, fill #0

## 2024-05-25 ENCOUNTER — Other Ambulatory Visit: Payer: Self-pay

## 2024-05-25 ENCOUNTER — Other Ambulatory Visit (HOSPITAL_COMMUNITY): Payer: Self-pay

## 2024-05-25 MED ORDER — ROSUVASTATIN CALCIUM 20 MG PO TABS
20.0000 mg | ORAL_TABLET | Freq: Every evening | ORAL | 0 refills | Status: DC
  Filled 2024-05-26 – 2024-06-21 (×14): qty 90, 90d supply, fill #0

## 2024-05-25 MED ORDER — GABAPENTIN 300 MG PO CAPS
300.0000 mg | ORAL_CAPSULE | Freq: Three times a day (TID) | ORAL | 0 refills | Status: DC
  Filled 2024-06-28 – 2024-07-12 (×2): qty 270, 90d supply, fill #0

## 2024-05-25 MED ORDER — LISINOPRIL 10 MG PO TABS
10.0000 mg | ORAL_TABLET | Freq: Every day | ORAL | 2 refills | Status: DC
  Filled 2024-05-25 – 2024-05-26 (×2): qty 30, 30d supply, fill #0
  Filled 2024-06-18: qty 30, 30d supply, fill #1
  Filled 2024-07-18: qty 30, 30d supply, fill #2

## 2024-05-25 MED ORDER — VITAMIN D (ERGOCALCIFEROL) 1.25 MG (50000 UNIT) PO CAPS
50000.0000 [IU] | ORAL_CAPSULE | ORAL | 0 refills | Status: DC
  Filled 2024-06-25: qty 13, 91d supply, fill #0

## 2024-05-25 MED ORDER — ONETOUCH VERIO FLEX SYSTEM W/DEVICE KIT
1.0000 | PACK | 0 refills | Status: AC
  Filled 2024-05-25 – 2024-05-27 (×2): qty 1, 30d supply, fill #0
  Filled 2024-05-28: qty 1, 1d supply, fill #0
  Filled 2024-06-28: qty 1, 30d supply, fill #0
  Filled 2024-07-02: qty 1, 1d supply, fill #0
  Filled 2024-07-12 – 2024-07-13 (×2): qty 1, 90d supply, fill #0
  Filled 2024-07-19: qty 1, 28d supply, fill #0
  Filled 2024-07-21 – 2024-09-21 (×2): qty 1, 30d supply, fill #0

## 2024-05-25 MED ORDER — LEVETIRACETAM 1000 MG PO TABS
1000.0000 mg | ORAL_TABLET | Freq: Every evening | ORAL | 0 refills | Status: DC
  Filled 2024-05-25: qty 100, 100d supply, fill #0

## 2024-05-25 MED ORDER — OZEMPIC (1 MG/DOSE) 4 MG/3ML ~~LOC~~ SOPN
1.0000 mg | PEN_INJECTOR | SUBCUTANEOUS | 3 refills | Status: AC
  Filled 2024-05-26 – 2024-07-12 (×5): qty 9, 84d supply, fill #0
  Filled 2024-09-21: qty 9, 84d supply, fill #1

## 2024-05-25 MED ORDER — CONTOUR NEXT TEST VI STRP
ORAL_STRIP | 1 refills | Status: AC
  Filled 2024-05-25: qty 300, 90d supply, fill #0
  Filled ????-??-??: fill #0

## 2024-05-26 ENCOUNTER — Other Ambulatory Visit (HOSPITAL_COMMUNITY): Payer: Self-pay

## 2024-05-26 ENCOUNTER — Telehealth: Payer: Self-pay

## 2024-05-26 ENCOUNTER — Encounter: Payer: Self-pay | Admitting: Pharmacist

## 2024-05-26 ENCOUNTER — Other Ambulatory Visit: Payer: Self-pay

## 2024-05-26 NOTE — Patient Outreach (Signed)
 First telephone outreach attempt to obtain mRS. No answer. Left message for returned call.  [facility]  Population Health Care Management Assistant  Direct Dial: [phone]  Fax: [phone] Website: [email or web address]

## 2024-05-27 ENCOUNTER — Other Ambulatory Visit (HOSPITAL_COMMUNITY): Payer: Self-pay

## 2024-05-27 ENCOUNTER — Other Ambulatory Visit: Payer: Self-pay

## 2024-05-28 ENCOUNTER — Telehealth: Payer: Self-pay

## 2024-05-28 ENCOUNTER — Other Ambulatory Visit: Payer: Self-pay

## 2024-05-28 NOTE — Patient Outreach (Signed)
 Second telephone outreach attempt to obtain mRS. No answer. Left message for returned call.  Shereen [facility]  Population Health Care Management Assistant  Direct Dial: [phone]  Fax: [phone] Website: [email or web address]

## 2024-05-29 ENCOUNTER — Other Ambulatory Visit (HOSPITAL_COMMUNITY): Payer: Self-pay

## 2024-05-31 ENCOUNTER — Other Ambulatory Visit (HOSPITAL_COMMUNITY): Payer: Self-pay

## 2024-06-01 ENCOUNTER — Other Ambulatory Visit (HOSPITAL_COMMUNITY): Payer: Self-pay

## 2024-06-01 ENCOUNTER — Telehealth: Payer: Self-pay

## 2024-06-01 NOTE — Patient Outreach (Signed)
 3 outreach attempts were completed to obtain mRs. mRs could not be obtained because patient never returned my calls. mRs=7    Shereen Gin [facility] Care Management Assistant  Direct Dial: [phone]  Fax: [phone] Website: [email or web address]

## 2024-06-02 ENCOUNTER — Other Ambulatory Visit: Payer: Self-pay

## 2024-06-02 ENCOUNTER — Other Ambulatory Visit (HOSPITAL_COMMUNITY): Payer: Self-pay

## 2024-06-03 ENCOUNTER — Other Ambulatory Visit (HOSPITAL_COMMUNITY): Payer: Self-pay

## 2024-06-04 ENCOUNTER — Other Ambulatory Visit (HOSPITAL_COMMUNITY): Payer: Self-pay

## 2024-06-05 ENCOUNTER — Other Ambulatory Visit (HOSPITAL_COMMUNITY): Payer: Self-pay

## 2024-06-07 ENCOUNTER — Other Ambulatory Visit: Payer: Self-pay

## 2024-06-07 ENCOUNTER — Other Ambulatory Visit (HOSPITAL_COMMUNITY): Payer: Self-pay

## 2024-06-08 ENCOUNTER — Other Ambulatory Visit (HOSPITAL_COMMUNITY): Payer: Self-pay

## 2024-06-09 ENCOUNTER — Other Ambulatory Visit (HOSPITAL_COMMUNITY): Payer: Self-pay

## 2024-06-21 ENCOUNTER — Other Ambulatory Visit (HOSPITAL_COMMUNITY): Payer: Self-pay

## 2024-06-21 ENCOUNTER — Other Ambulatory Visit: Payer: Self-pay

## 2024-06-25 ENCOUNTER — Other Ambulatory Visit (HOSPITAL_COMMUNITY): Payer: Self-pay

## 2024-06-25 ENCOUNTER — Other Ambulatory Visit: Payer: Self-pay

## 2024-06-28 ENCOUNTER — Other Ambulatory Visit: Payer: Self-pay

## 2024-06-28 ENCOUNTER — Other Ambulatory Visit (HOSPITAL_COMMUNITY): Payer: Self-pay

## 2024-06-29 ENCOUNTER — Other Ambulatory Visit: Payer: Self-pay

## 2024-06-29 ENCOUNTER — Other Ambulatory Visit (HOSPITAL_COMMUNITY): Payer: Self-pay

## 2024-07-01 ENCOUNTER — Other Ambulatory Visit (HOSPITAL_COMMUNITY): Payer: Self-pay

## 2024-07-02 ENCOUNTER — Other Ambulatory Visit (HOSPITAL_BASED_OUTPATIENT_CLINIC_OR_DEPARTMENT_OTHER): Payer: Self-pay

## 2024-07-02 ENCOUNTER — Other Ambulatory Visit (HOSPITAL_COMMUNITY): Payer: Self-pay

## 2024-07-02 ENCOUNTER — Other Ambulatory Visit: Payer: Self-pay

## 2024-07-02 MED ORDER — TRUE METRIX METER W/DEVICE KIT
PACK | 0 refills | Status: AC
  Filled 2024-07-02: qty 1, 30d supply, fill #0
  Filled 2024-07-02: qty 1, 90d supply, fill #0
  Filled 2024-07-06: qty 1, 1d supply, fill #0

## 2024-07-02 MED ORDER — ACCU-CHEK GUIDE TEST VI STRP
ORAL_STRIP | 3 refills | Status: AC
  Filled 2024-07-02: qty 300, 100d supply, fill #0
  Filled 2024-07-02: qty 300, 90d supply, fill #0
  Filled 2024-07-06: qty 300, 100d supply, fill #0
  Filled 2024-09-21: qty 300, 100d supply, fill #1

## 2024-07-02 MED ORDER — ONETOUCH DELICA PLUS LANCET33G MISC
3 refills | Status: AC
  Filled 2024-07-02: qty 300, 100d supply, fill #0
  Filled 2024-07-02: qty 300, 90d supply, fill #0
  Filled 2024-07-06 – 2024-09-21 (×2): qty 300, 100d supply, fill #0

## 2024-07-06 ENCOUNTER — Other Ambulatory Visit: Payer: Self-pay

## 2024-07-06 ENCOUNTER — Other Ambulatory Visit (HOSPITAL_COMMUNITY): Payer: Self-pay

## 2024-07-07 ENCOUNTER — Other Ambulatory Visit (HOSPITAL_BASED_OUTPATIENT_CLINIC_OR_DEPARTMENT_OTHER): Payer: Self-pay

## 2024-07-07 ENCOUNTER — Other Ambulatory Visit (HOSPITAL_COMMUNITY): Payer: Self-pay

## 2024-07-12 ENCOUNTER — Other Ambulatory Visit: Payer: Self-pay

## 2024-07-12 ENCOUNTER — Other Ambulatory Visit (HOSPITAL_COMMUNITY): Payer: Self-pay

## 2024-07-13 ENCOUNTER — Other Ambulatory Visit (HOSPITAL_COMMUNITY): Payer: Self-pay

## 2024-07-13 ENCOUNTER — Other Ambulatory Visit: Payer: Self-pay

## 2024-07-16 ENCOUNTER — Other Ambulatory Visit (HOSPITAL_COMMUNITY): Payer: Self-pay

## 2024-07-19 ENCOUNTER — Other Ambulatory Visit (HOSPITAL_COMMUNITY): Payer: Self-pay

## 2024-07-20 ENCOUNTER — Other Ambulatory Visit (HOSPITAL_COMMUNITY): Payer: Self-pay

## 2024-07-21 ENCOUNTER — Other Ambulatory Visit (HOSPITAL_COMMUNITY): Payer: Self-pay

## 2024-08-03 ENCOUNTER — Other Ambulatory Visit (HOSPITAL_COMMUNITY): Payer: Self-pay

## 2024-08-03 MED ORDER — CIPROFLOXACIN HCL 500 MG PO TABS
500.0000 mg | ORAL_TABLET | Freq: Two times a day (BID) | ORAL | 0 refills | Status: AC
  Filled 2024-08-03: qty 14, 7d supply, fill #0

## 2024-09-12 ENCOUNTER — Other Ambulatory Visit (HOSPITAL_COMMUNITY): Payer: Self-pay

## 2024-09-13 ENCOUNTER — Other Ambulatory Visit: Payer: Self-pay

## 2024-09-13 ENCOUNTER — Other Ambulatory Visit (HOSPITAL_COMMUNITY): Payer: Self-pay

## 2024-09-13 MED ORDER — ROSUVASTATIN CALCIUM 20 MG PO TABS
20.0000 mg | ORAL_TABLET | Freq: Every day | ORAL | 0 refills | Status: AC
  Filled 2024-09-13: qty 90, 90d supply, fill #0

## 2024-09-17 ENCOUNTER — Other Ambulatory Visit (HOSPITAL_COMMUNITY): Payer: Self-pay

## 2024-09-17 MED ORDER — VITAMIN D (ERGOCALCIFEROL) 1.25 MG (50000 UNIT) PO CAPS
ORAL_CAPSULE | ORAL | 0 refills | Status: AC
  Filled 2024-09-17: qty 13, 90d supply, fill #0

## 2024-09-21 ENCOUNTER — Other Ambulatory Visit (HOSPITAL_COMMUNITY): Payer: Self-pay

## 2024-09-21 ENCOUNTER — Other Ambulatory Visit: Payer: Self-pay

## 2024-09-22 ENCOUNTER — Other Ambulatory Visit: Payer: Self-pay

## 2024-09-22 ENCOUNTER — Other Ambulatory Visit (HOSPITAL_COMMUNITY): Payer: Self-pay

## 2024-09-22 MED ORDER — LISINOPRIL 10 MG PO TABS
10.0000 mg | ORAL_TABLET | Freq: Every day | ORAL | 2 refills | Status: AC
  Filled 2024-09-22: qty 30, 30d supply, fill #0
  Filled 2024-10-17: qty 30, 30d supply, fill #1

## 2024-09-22 MED ORDER — GABAPENTIN 300 MG PO CAPS
300.0000 mg | ORAL_CAPSULE | Freq: Three times a day (TID) | ORAL | 0 refills | Status: DC
  Filled 2024-09-22 – 2024-10-08 (×3): qty 270, 90d supply, fill #0

## 2024-09-22 MED ORDER — LEVETIRACETAM 1000 MG PO TABS
1000.0000 mg | ORAL_TABLET | Freq: Every evening | ORAL | 0 refills | Status: AC
  Filled 2024-09-22: qty 100, 100d supply, fill #0

## 2024-10-04 ENCOUNTER — Other Ambulatory Visit (HOSPITAL_COMMUNITY): Payer: Self-pay

## 2024-10-04 ENCOUNTER — Other Ambulatory Visit: Payer: Self-pay

## 2024-10-08 ENCOUNTER — Other Ambulatory Visit (HOSPITAL_COMMUNITY): Payer: Self-pay

## 2024-10-08 ENCOUNTER — Other Ambulatory Visit: Payer: Self-pay

## 2024-10-20 ENCOUNTER — Other Ambulatory Visit (HOSPITAL_COMMUNITY): Payer: Self-pay

## 2024-10-20 MED ORDER — GABAPENTIN 300 MG PO CAPS
ORAL_CAPSULE | ORAL | 4 refills | Status: AC
  Filled 2024-10-21: qty 450, 90d supply, fill #0

## 2024-10-21 ENCOUNTER — Other Ambulatory Visit: Payer: Self-pay
